# Patient Record
Sex: Female | Born: 1945
Health system: Southern US, Community
[De-identification: ages and names within clinical notes are randomized; demographics above are authoritative.]

## PROBLEM LIST (undated history)

## (undated) DIAGNOSIS — F329 Major depressive disorder, single episode, unspecified: Secondary | ICD-10-CM

## (undated) DIAGNOSIS — E039 Hypothyroidism, unspecified: Secondary | ICD-10-CM

## (undated) DIAGNOSIS — D126 Benign neoplasm of colon, unspecified: Secondary | ICD-10-CM

## (undated) DIAGNOSIS — E785 Hyperlipidemia, unspecified: Secondary | ICD-10-CM

## (undated) DIAGNOSIS — T7840XA Allergy, unspecified, initial encounter: Secondary | ICD-10-CM

## (undated) DIAGNOSIS — I10 Essential (primary) hypertension: Secondary | ICD-10-CM

## (undated) DIAGNOSIS — K219 Gastro-esophageal reflux disease without esophagitis: Secondary | ICD-10-CM

## (undated) DIAGNOSIS — C801 Malignant (primary) neoplasm, unspecified: Secondary | ICD-10-CM

## (undated) DIAGNOSIS — K5790 Diverticulosis of intestine, part unspecified, without perforation or abscess without bleeding: Secondary | ICD-10-CM

## (undated) DIAGNOSIS — K222 Esophageal obstruction: Secondary | ICD-10-CM

## (undated) DIAGNOSIS — F419 Anxiety disorder, unspecified: Secondary | ICD-10-CM

## (undated) DIAGNOSIS — K5909 Other constipation: Secondary | ICD-10-CM

## (undated) DIAGNOSIS — F1010 Alcohol abuse, uncomplicated: Secondary | ICD-10-CM

## (undated) DIAGNOSIS — F32A Depression, unspecified: Secondary | ICD-10-CM

## (undated) DIAGNOSIS — M1990 Unspecified osteoarthritis, unspecified site: Secondary | ICD-10-CM

## (undated) DIAGNOSIS — C719 Malignant neoplasm of brain, unspecified: Secondary | ICD-10-CM

## (undated) DIAGNOSIS — F1910 Other psychoactive substance abuse, uncomplicated: Secondary | ICD-10-CM

## (undated) HISTORY — PX: COLONOSCOPY: SHX174

## (undated) HISTORY — DX: Major depressive disorder, single episode, unspecified: F32.9

## (undated) HISTORY — DX: Other psychoactive substance abuse, uncomplicated: F19.10

## (undated) HISTORY — DX: Gastro-esophageal reflux disease without esophagitis: K21.9

## (undated) HISTORY — DX: Alcohol abuse, uncomplicated: F10.10

## (undated) HISTORY — DX: Malignant (primary) neoplasm, unspecified: C80.1

## (undated) HISTORY — DX: Unspecified osteoarthritis, unspecified site: M19.90

## (undated) HISTORY — DX: Malignant neoplasm of brain, unspecified: C71.9

## (undated) HISTORY — PX: BRAIN SURGERY: SHX531

## (undated) HISTORY — PX: TONSILLECTOMY: SUR1361

## (undated) HISTORY — DX: Esophageal obstruction: K22.2

## (undated) HISTORY — DX: Anxiety disorder, unspecified: F41.9

## (undated) HISTORY — DX: Benign neoplasm of colon, unspecified: D12.6

## (undated) HISTORY — DX: Diverticulosis of intestine, part unspecified, without perforation or abscess without bleeding: K57.90

## (undated) HISTORY — DX: Other constipation: K59.09

## (undated) HISTORY — PX: EYELID REPAIR W/ SKIN GRAFT: SHX1565

## (undated) HISTORY — DX: Hypothyroidism, unspecified: E03.9

## (undated) HISTORY — DX: Depression, unspecified: F32.A

## (undated) HISTORY — DX: Essential (primary) hypertension: I10

## (undated) HISTORY — DX: Allergy, unspecified, initial encounter: T78.40XA

## (undated) HISTORY — DX: Hyperlipidemia, unspecified: E78.5

---

## 1993-08-20 ENCOUNTER — Encounter: Payer: Self-pay | Admitting: Gastroenterology

## 1993-10-08 ENCOUNTER — Encounter: Payer: Self-pay | Admitting: Gastroenterology

## 1998-03-03 ENCOUNTER — Other Ambulatory Visit: Admission: RE | Admit: 1998-03-03 | Discharge: 1998-03-03 | Payer: Self-pay | Admitting: Obstetrics and Gynecology

## 1999-02-19 ENCOUNTER — Other Ambulatory Visit: Admission: RE | Admit: 1999-02-19 | Discharge: 1999-02-19 | Payer: Self-pay | Admitting: Obstetrics and Gynecology

## 1999-04-01 ENCOUNTER — Encounter: Admission: RE | Admit: 1999-04-01 | Discharge: 1999-04-01 | Payer: Self-pay | Admitting: Obstetrics and Gynecology

## 2001-04-17 ENCOUNTER — Encounter: Payer: Self-pay | Admitting: Gastroenterology

## 2001-07-16 ENCOUNTER — Other Ambulatory Visit: Admission: RE | Admit: 2001-07-16 | Discharge: 2001-07-16 | Payer: Self-pay | Admitting: Obstetrics and Gynecology

## 2002-01-16 ENCOUNTER — Encounter: Payer: Self-pay | Admitting: Gastroenterology

## 2002-07-29 ENCOUNTER — Other Ambulatory Visit: Admission: RE | Admit: 2002-07-29 | Discharge: 2002-07-29 | Payer: Self-pay | Admitting: Obstetrics and Gynecology

## 2003-09-10 ENCOUNTER — Other Ambulatory Visit: Admission: RE | Admit: 2003-09-10 | Discharge: 2003-09-10 | Payer: Self-pay | Admitting: Obstetrics and Gynecology

## 2004-10-20 ENCOUNTER — Other Ambulatory Visit: Admission: RE | Admit: 2004-10-20 | Discharge: 2004-10-20 | Payer: Self-pay | Admitting: Obstetrics and Gynecology

## 2004-10-24 ENCOUNTER — Emergency Department (HOSPITAL_COMMUNITY): Admission: EM | Admit: 2004-10-24 | Discharge: 2004-10-24 | Payer: Self-pay | Admitting: Emergency Medicine

## 2005-08-02 ENCOUNTER — Encounter: Admission: RE | Admit: 2005-08-02 | Discharge: 2005-08-02 | Payer: Self-pay | Admitting: Family Medicine

## 2005-10-24 ENCOUNTER — Other Ambulatory Visit: Admission: RE | Admit: 2005-10-24 | Discharge: 2005-10-24 | Payer: Self-pay | Admitting: Obstetrics and Gynecology

## 2007-03-21 DIAGNOSIS — D126 Benign neoplasm of colon, unspecified: Secondary | ICD-10-CM

## 2007-03-21 HISTORY — DX: Benign neoplasm of colon, unspecified: D12.6

## 2007-03-26 ENCOUNTER — Ambulatory Visit: Payer: Self-pay | Admitting: Gastroenterology

## 2007-04-06 ENCOUNTER — Encounter: Payer: Self-pay | Admitting: Gastroenterology

## 2007-04-06 ENCOUNTER — Ambulatory Visit: Payer: Self-pay | Admitting: Gastroenterology

## 2007-09-28 ENCOUNTER — Ambulatory Visit: Payer: Self-pay | Admitting: Psychology

## 2007-10-11 ENCOUNTER — Ambulatory Visit: Payer: Self-pay | Admitting: Psychology

## 2007-11-28 ENCOUNTER — Ambulatory Visit: Payer: Self-pay | Admitting: Psychology

## 2008-11-24 ENCOUNTER — Telehealth: Payer: Self-pay | Admitting: Gastroenterology

## 2008-11-26 ENCOUNTER — Ambulatory Visit: Payer: Self-pay | Admitting: Internal Medicine

## 2008-11-26 DIAGNOSIS — E039 Hypothyroidism, unspecified: Secondary | ICD-10-CM | POA: Insufficient documentation

## 2008-11-26 DIAGNOSIS — Z860100 Personal history of colon polyps, unspecified: Secondary | ICD-10-CM | POA: Insufficient documentation

## 2008-11-26 DIAGNOSIS — K5900 Constipation, unspecified: Secondary | ICD-10-CM | POA: Insufficient documentation

## 2008-11-26 DIAGNOSIS — F329 Major depressive disorder, single episode, unspecified: Secondary | ICD-10-CM

## 2008-12-30 ENCOUNTER — Telehealth: Payer: Self-pay | Admitting: Gastroenterology

## 2009-01-26 ENCOUNTER — Ambulatory Visit: Payer: Self-pay | Admitting: Gastroenterology

## 2009-01-26 DIAGNOSIS — R079 Chest pain, unspecified: Secondary | ICD-10-CM | POA: Insufficient documentation

## 2009-01-26 DIAGNOSIS — K219 Gastro-esophageal reflux disease without esophagitis: Secondary | ICD-10-CM

## 2009-07-27 ENCOUNTER — Encounter (INDEPENDENT_AMBULATORY_CARE_PROVIDER_SITE_OTHER): Payer: Self-pay | Admitting: *Deleted

## 2009-07-28 ENCOUNTER — Ambulatory Visit: Payer: Self-pay | Admitting: Gastroenterology

## 2009-08-10 ENCOUNTER — Ambulatory Visit: Payer: Self-pay | Admitting: Gastroenterology

## 2009-08-12 ENCOUNTER — Encounter: Payer: Self-pay | Admitting: Gastroenterology

## 2009-10-26 ENCOUNTER — Ambulatory Visit: Payer: Self-pay | Admitting: Gastroenterology

## 2009-10-26 DIAGNOSIS — K222 Esophageal obstruction: Secondary | ICD-10-CM

## 2010-07-20 NOTE — Letter (Signed)
Summary: Appt Reminder 2  [facility]  [address] [place], [place] [address]   Phone: [phone]  Fax: [phone]        August 10, 2009 MRN: [id number]    [facility] [address] [place], [place]  [address]    Dear Ms. Bechtol,   You have a return appointment with Dr. Russella Dar on 09/28/09 at 1:30 pm.  Please remember to bring a complete list of the medicines you are taking, your insurance card and your co-pay.  If you have to cancel or reschedule this appointment, please call before 5:00 pm the evening before to avoid a cancellation fee.  If you have any questions or concerns, please call [phone].    Sincerely,    Darcey Nora RN, CGRN

## 2010-07-20 NOTE — Assessment & Plan Note (Signed)
Summary: follow up EGD/sheri   History of Present Illness Visit Type: Follow-up Visit Primary GI MD: Elie Goody MD [facility] Primary Provider: Tracey Harries, MD Chief Complaint: solid food dysphagia, pt states it feels like her esophagus is tightening again History of Present Illness:   Kristina Anthony returns today with recurrent dysphagia. She initially had an excellent response to dilation without any symptoms, but over the past 2 weeks, she has noted occasional mild solid food dysphagia. She is taking omeprazole intermittently and not on a daily basis is recommended.   GI Review of Systems    Reports dysphagia with solids.      Denies abdominal pain, acid reflux, belching, bloating, chest pain, dysphagia with liquids, heartburn, loss of appetite, nausea, vomiting, vomiting blood, weight loss, and  weight gain.        Denies anal fissure, black tarry stools, change in bowel habit, constipation, diarrhea, diverticulosis, fecal incontinence, heme positive stool, hemorrhoids, irritable bowel syndrome, jaundice, light color stool, liver problems, rectal bleeding, and  rectal pain.   Current Medications (verified): 1)  Levothyroxine (Unknown Dosage) .... Take 1 Tablet By Mouth Once A Day 2)  Multivitamins  Tabs (Multiple Vitamin) .... Take 1 Tablet By Mouth Once A Day 3)  Vitamin D (Unknown Dosage) .... Take 1 Tablet By Mouth Once A Day 4)  Calcium (Unknown Dosage) .... Take 1 Tablet By Mouth Once A Day 5)  Miralax  Powd (Polyethylene Glycol 3350) .... Take 1 Scoop Dissolved in Water Every Other Day 6)  Omeprazole 40 Mg  Cpdr (Omeprazole) .Marland Kitchen.. 1 Each Day 30 Minutes Before Meal  Allergies (verified): No Known Drug Allergies  Past History:  Past Medical History: COLON POLYPS, ADENOMATOUS 03/2007 DIVERTICULOSIS CHRONIC CONSTIPATION HYPOTHYROIDISM ANXIETY H/O ALCOHOL ABUSE ESOPHAGEAL STRICTURE  Past Surgical History: Reviewed history from 11/26/2008 and no changes required. Eyelid  Surgery- Bilateral Tonsillectomy  Family History: Reviewed history from 11/26/2008 and no changes required. Family History of Colon Cancer: Mother Family History of Prostate Cancer: Prostate Family History of Colon Polyps: Brother, Father, Mother Family History of Heart Disease: Grandfather  Social History: Reviewed history from 11/26/2008 and no changes required. Patient currently smokes. -very rare Alcohol Use - yes-3-4 glasses per week Daily Caffeine Use-1.5 cups daily Illicit Drug Use - no Patient gets regular exercise.  Review of Systems       The pertinent positives and negatives are noted as above and in the HPI. All other ROS were reviewed and were negative.   Vital Signs:  Patient profile:   65 year old female Height:      69 inches Weight:      164 pounds BMI:     24.31 Pulse rate:   76 / minute Pulse rhythm:   regular BP sitting:   124 / 82  (left arm) Cuff size:   regular  Vitals Entered By: Francee Piccolo CMA Duncan Dull) (Oct 26, 2009 4:12 PM)  Physical Exam  General:  Well developed, well nourished, no acute distress. Head:  Normocephalic and atraumatic.  Impression & Recommendations:  Problem # 1:  ESOPHAGEAL STRICTURE (ICD-530.3) Recurrent dysphagia post dilation in 07/2009. Rule out GERD versus a recurrent stricture. Intensify all antireflux measures. She is advised to take omeprazole 40 mg daily 30 minutes for breakfast every day long-term. For dysphagia does not improve over the next few weeks. She will call to set up appointment for repeat endoscopy with Savary dilation.  Problem # 2:  PERSONAL HX COLONIC POLYPS (ICD-V12.72) History of  adenomatous colon polyps in October 2008. Surveillance colonoscopy recommended in October 2013.  Problem # 3:  FAMILY HX COLON CANCER (ICD-V16.0) See problem #2.  Problem # 4:  CONSTIPATION (ICD-564.00) Continue a high fiber diet with adequate daily water intake, and MiraLax.  Patient Instructions: 1)  Avoid  foods high in acid content ( tomatoes, citrus juices, spicy foods) . Avoid eating within 3 to 4 hours of lying down or before exercising. Do not over eat; try smaller more frequent meals. Elevate head of bed four inches when sleeping.  2)  Copy sent to : Tracey Harries, MD 3)  The medication list was reviewed and reconciled.  All changed / newly prescribed medications were explained.  A complete medication list was provided to the patient / caregiver.

## 2010-07-20 NOTE — Procedures (Signed)
Summary: [facility]   Imported By: Sherian Rein 10/27/2009 11:12:58  _____________________________________________________________________  External Attachment:    Type:   Image     Comment:   External Document

## 2010-07-20 NOTE — Consult Note (Signed)
Summary: GI Consult/[facility]  GI Consult/[facility]   Imported By: Sherian Rein 10/27/2009 11:15:38  _____________________________________________________________________  External Attachment:    Type:   Image     Comment:   External Document

## 2010-07-20 NOTE — Letter (Signed)
Summary: EGD Instructions  [facility]  [address] [place], [place] [address]   Phone: [phone]  Fax: [phone]       Jadis FOCHT    [birth year]    MRN: [id number]       Procedure Day /Date: Monday 08/10/09     Arrival Time:  9:00 am     Procedure Time: 10:00 am     Location of Procedure:                    _x _  [facility]oscopy Center (4th Floor)   PREPARATION FOR ENDOSCOPY   On Monday 08/10/09 THE DAY OF THE PROCEDURE:  1.   No solid foods, milk or milk products are allowed after midnight the night before your procedure.  2.   Do not drink anything colored red or purple.  Avoid juices with pulp.  No orange juice.  3.  You may drink clear liquids until 8:00 am, which is 2 hours before your procedure.                                                                                                CLEAR LIQUIDS INCLUDE: Water Jello Ice Popsicles Tea (sugar ok, no milk/cream) Powdered fruit flavored drinks Coffee (sugar ok, no milk/cream) Gatorade Juice: apple, white grape, white cranberry  Lemonade Clear bullion, consomm, broth Carbonated beverages (any kind) Strained chicken noodle soup Hard Candy   MEDICATION INSTRUCTIONS  Unless otherwise instructed, you should take regular prescription medications with a small sip of water as early as possible the morning of your procedure.               OTHER INSTRUCTIONS  You will need a responsible adult at least 63 years of age to accompany you and drive you home.   This person must remain in the waiting room during your procedure.  Wear loose fitting clothing that is easily removed.  Leave jewelry and other valuables at home.  However, you may wish to bring a book to read or an iPod/MP3 player to listen to music as you wait for your procedure to start.  Remove all body piercing jewelry and leave at home.  Total time from sign-in until discharge is approximately 2-3 hours.  You should  go home directly after your procedure and rest.  You can resume normal activities the day after your procedure.  The day of your procedure you should not:   Drive   Make legal decisions   Operate machinery   Drink alcohol   Return to work  You will receive specific instructions about eating, activities and medications before you leave.    The above instructions have been reviewed and explained to me by   Clide Cliff, RN______________________    I fully understand and can verbalize these instructions _____________________________ Date _________

## 2010-07-20 NOTE — Miscellaneous (Signed)
Summary: Omeprazole rx  Clinical Lists Changes  Medications: Added new medication of OMEPRAZOLE 40 MG  CPDR (OMEPRAZOLE) 1 each day 30 minutes before meal - Signed Rx of OMEPRAZOLE 40 MG  CPDR (OMEPRAZOLE) 1 each day 30 minutes before meal;  #30 x 11;  Signed;  Entered by: Karl Bales RN;  Authorized by: Meryl Dare MD [facility];  Method used: Electronically to [id number]*, [address], [place], [place], [place]  [address], Ph: [phone], Fax: [phone]    Prescriptions: OMEPRAZOLE 40 MG  CPDR (OMEPRAZOLE) 1 each day 30 minutes before meal  #30 x 11   Entered by:   Karl Bales RN   Authorized by:   Meryl Dare MD [facility]   Signed by:   Karl Bales RN on 08/10/2009   Method used:   Electronically to        [facility] [phone]* (retail)       [address]       [place], [place]  [address]       Ph: [phone]       Fax: [phone]   RxID:   [id number]

## 2010-07-20 NOTE — Letter (Signed)
Summary: [facility] Biopsy Results  [facility]  [address] [place], [place] [address]   Phone: [phone]  Fax: [phone]        August 12, 2009 MRN: [id number]    [facility] [address] [place], [place]  [address]    Dear Ms. Degeorge,  I am pleased to inform you that the biopsies taken during your recent endoscopic examination did not show any evidence of cancer upon pathologic examination. The biopsy showed chronic inflammation.  Continue with the treatment plan as outlined on the day of your      exam.  Please call us if you are having persistent problems or have questions about your condition that have not been fully answered at this time.  Sincerely,  Meryl Dare MD [facility]  This letter has been electronically signed by your physician.  Appended Document: Patient Notice-Endo Biopsy Results Letter mailed 2.25.11

## 2010-07-20 NOTE — Procedures (Signed)
Summary: Upper Endoscopy  Patient: [name] Note: All result statuses are Final unless otherwise noted.  Tests: (1) Upper Endoscopy (EGD)   EGD Upper Endoscopy       DONE     [facility]     [address]     [place], [place]  [address]           ENDOSCOPY PROCEDURE REPORT           PATIENT:  Kristina Anthony, Kristina Anthony  MR#:  [id number]     BIRTHDATE:  01/24/1946, 64 yrs. old  GENDER:  female           ENDOSCOPIST:  Judie Petit T. Russella Dar, MD, [facility]           PROCEDURE DATE:  08/10/2009     PROCEDURE:  EGD with dilatation over guidewire, and with biopsy     ASA CLASS:  Class II     INDICATIONS:  GERD, chest pain, dysphagia           MEDICATIONS:  Fentanyl 100 mcg IV, Versed 12 mg IV     TOPICAL ANESTHETIC:  Exactacain Spray           DESCRIPTION OF PROCEDURE:   After the risks benefits and     alternatives of the procedure were thoroughly explained, informed     consent was obtained.  The [facility] [id number] endoscope was     introduced through the mouth and advanced to the second portion of     the duodenum, without limitations.  The instrument was slowly     withdrawn as the mucosa was fully examined.     <<PROCEDUREIMAGES>>           A stricture was found at the gastroesophageal junction. It was     bening appearing, measuring 13 mm in diameter. Multiple biopsies     were obtained and sent to pathology. Savary dilation over a     guidewire with 14 mm Savary, 15 mm Savary and 16 mm Savary with     minimal resistance to all 3 dilators and a small amount of heme on     the last dilator. The stomach was entered and closely examined.     The pylorus, antrum, angularis, and lesser curvature were well     visualized, including a retroflexed view of the cardia and fundus.     The stomach wall was normally distensable. The scope passed easily     through the pylorus into the duodenum. The duodenal bulb was     normal in appearance, as was the postbulbar duodenum. Retroflexed  views revealed a hiatal hernia., small.  The scope was then     withdrawn from the patient and the procedure completed.           COMPLICATIONS:  None           ENDOSCOPIC IMPRESSION:     1) Stricture at the gastroesophageal junction     2) Small hiatal hernia           RECOMMENDATIONS:     1) await pathology results     2) PPI qam long term     3) post dilation instructions     4) office follow up in 4-6 weeks     5) antireflux measures long term           Judie Petit T. Russella Dar, MD, [facility]           CC:  Tracey Harries,  MD           n.     Rosalie DoctorJudie Petit T. Elenna Spratling at 08/10/2009 10:21 AM           Felicity Pellegrini, [id number]  Note: An exclamation mark (!) indicates a result that was not dispersed into the flowsheet. Document Creation Date: 08/10/2009 10:21 AM _______________________________________________________________________  (1) Order result status: Final Collection or observation date-time: 08/10/2009 10:10 Requested date-time:  Receipt date-time:  Reported date-time:  Referring Physician:   Ordering Physician: Claudette Head [phone]) Specimen Source:  Source: Launa Grill Order Number: [id number] Lab site:

## 2010-07-20 NOTE — Miscellaneous (Signed)
Summary: 530.81,584.00,786.50--ch.  Clinical Lists Changes  Observations: Added new observation of ALLERGY REV: Done (07/28/2009 16:08)

## 2010-07-20 NOTE — Letter (Signed)
Summary: Melonie Florida MD  Melonie Florida MD   Imported By: Sherian Rein 10/27/2009 11:14:14  _____________________________________________________________________  External Attachment:    Type:   Image     Comment:   External Document

## 2010-09-11 ENCOUNTER — Other Ambulatory Visit: Payer: Self-pay | Admitting: Gastroenterology

## 2010-11-10 ENCOUNTER — Telehealth: Payer: Self-pay | Admitting: Gastroenterology

## 2010-11-10 ENCOUNTER — Ambulatory Visit: Payer: Self-pay | Admitting: Gastroenterology

## 2010-11-10 MED ORDER — OMEPRAZOLE 40 MG PO CPDR: 40.0000 mg | DELAYED_RELEASE_CAPSULE | Freq: Every day | ORAL | Status: DC

## 2010-11-10 NOTE — Telephone Encounter (Signed)
rx sent

## 2010-12-29 ENCOUNTER — Encounter: Payer: Self-pay | Admitting: Gastroenterology

## 2010-12-29 ENCOUNTER — Ambulatory Visit (INDEPENDENT_AMBULATORY_CARE_PROVIDER_SITE_OTHER): Payer: BC Managed Care – PPO | Admitting: Gastroenterology

## 2010-12-29 VITALS — BP 128/70 | HR 88 | Ht 69.0 in | Wt 168.0 lb

## 2010-12-29 DIAGNOSIS — Z800 Family history of malignant neoplasm of digestive organs: Secondary | ICD-10-CM

## 2010-12-29 DIAGNOSIS — K5900 Constipation, unspecified: Secondary | ICD-10-CM

## 2010-12-29 DIAGNOSIS — Z86010 Personal history of colonic polyps: Secondary | ICD-10-CM

## 2010-12-29 DIAGNOSIS — K219 Gastro-esophageal reflux disease without esophagitis: Secondary | ICD-10-CM

## 2010-12-29 MED ORDER — OMEPRAZOLE 40 MG PO CPDR: 40.0000 mg | DELAYED_RELEASE_CAPSULE | Freq: Every day | ORAL | Status: DC

## 2010-12-29 NOTE — Patient Instructions (Signed)
Your prescription has been sent to your pharmacy 

## 2010-12-29 NOTE — Progress Notes (Signed)
History of Present Illness: This is a 65 year old female who returns for followup of GERD with a history of esophageal stricture, constipation and adenomatous colon polyps. She underwent endoscopy in February 2011. Her reflux symptoms and dysphagia have remained under excellent control on omeprazole 40 mg daily. She has mild intermittent constipation and takes MiraLax on an as-needed basis. She denies weight loss, change in bowel habits, melena, hematochezia, dysphagia.  Current Medications, Allergies, Past Medical History, Past Surgical History, Family History and Social History were reviewed in [employer] record.  Physical Exam: General: Well developed , well nourished, no acute distress Head: Normocephalic and atraumatic Eyes:  sclerae anicteric, EOMI Ears: Normal auditory acuity Mouth: No deformity or lesions Lungs: Clear throughout to auscultation Heart: Regular rate and rhythm; no murmurs, rubs or bruits Abdomen: Soft, non tender and non distended. No masses, hepatosplenomegaly or hernias noted. Normal Bowel sounds Musculoskeletal: Symmetrical with no gross deformities  Pulses:  Normal pulses noted Neurological: Alert oriented x 4, grossly nonfocal Psychological:  Alert and cooperative. Normal mood and affect  Assessment and Recommendations:  1. GERD with a history of an esophageal stricture. Continue omeprazole 40 mg daily and standard antireflux measures.  2. Mild constipation. MiraLax when necessary.   3. Personal history of adenomatous colon polyps and a family history of colon cancer in her mother at age 13. Surveillance colonoscopy due October 2013.

## 2010-12-30 ENCOUNTER — Encounter: Payer: Self-pay | Admitting: Gastroenterology

## 2012-01-17 ENCOUNTER — Other Ambulatory Visit: Payer: Self-pay | Admitting: Gastroenterology

## 2012-01-17 NOTE — Telephone Encounter (Signed)
NEEDS OFFICE VISIT FOR ANY FURTHER REFILLS! 

## 2012-02-09 ENCOUNTER — Encounter: Payer: Self-pay | Admitting: Internal Medicine

## 2012-03-23 ENCOUNTER — Other Ambulatory Visit: Payer: Self-pay | Admitting: Gastroenterology

## 2012-03-23 MED ORDER — OMEPRAZOLE 40 MG PO CPDR: 40.0000 mg | DELAYED_RELEASE_CAPSULE | Freq: Every day | ORAL | Status: DC

## 2012-03-23 NOTE — Telephone Encounter (Signed)
NEEDS OFFICE VISIT FOR ANY FURTHER REFILLS! 

## 2012-03-23 NOTE — Telephone Encounter (Signed)
Patient denies any GI sx and just needs a refill until the first of the year when her insurance changes. Told her I will send several refills until then but to call back in November to schedule a follow up appt for January. Patient agreed and verbalized understanding.

## 2012-04-13 ENCOUNTER — Ambulatory Visit: Payer: BC Managed Care – PPO | Admitting: Gastroenterology

## 2012-05-22 ENCOUNTER — Ambulatory Visit: Payer: Self-pay | Admitting: Obstetrics and Gynecology

## 2012-07-09 ENCOUNTER — Encounter: Payer: Self-pay | Admitting: Gastroenterology

## 2012-07-09 ENCOUNTER — Ambulatory Visit (INDEPENDENT_AMBULATORY_CARE_PROVIDER_SITE_OTHER): Payer: Medicare Other | Admitting: Gastroenterology

## 2012-07-09 VITALS — BP 130/84 | HR 76 | Ht 68.0 in | Wt 174.2 lb

## 2012-07-09 DIAGNOSIS — Z860100 Personal history of colon polyps, unspecified: Secondary | ICD-10-CM

## 2012-07-09 DIAGNOSIS — K219 Gastro-esophageal reflux disease without esophagitis: Secondary | ICD-10-CM

## 2012-07-09 MED ORDER — OMEPRAZOLE 40 MG PO CPDR: 40.0000 mg | DELAYED_RELEASE_CAPSULE | Freq: Every day | ORAL | Status: DC

## 2012-07-09 MED ORDER — PEG-KCL-NACL-NASULF-NA ASC-C 100 G PO SOLR: 1.0000 | Freq: Once | ORAL | Status: DC

## 2012-07-09 NOTE — Progress Notes (Signed)
History of Present Illness: This is a 67 year old female with a history of GERD and peptic stricture. Reflux symptoms are under excellent control on omeprazole 40 mg daily. She has no gastrointestinal complaints except for rare episodes of constipation easily treated with MiraLax. She is due for surveillance colonoscopy. Denies weight loss, abdominal pain, diarrhea, change in stool caliber, melena, hematochezia, nausea, vomiting, dysphagia, reflux symptoms, chest pain.  Current Medications, Allergies, Past Medical History, Past Surgical History, Family History and Social History were reviewed in [employer] record.  Physical Exam: General: Well developed , well nourished, no acute distress Head: Normocephalic and atraumatic Eyes:  sclerae anicteric, EOMI Ears: Normal auditory acuity Mouth: No deformity or lesions Lungs: Clear throughout to auscultation Heart: Regular rate and rhythm; no murmurs, rubs or bruits Abdomen: Soft, non tender and non distended. No masses, hepatosplenomegaly or hernias noted. Normal Bowel sounds Rectal: Deferred to colonoscopy Musculoskeletal: Symmetrical with no gross deformities  Pulses:  Normal pulses noted Extremities: No clubbing, cyanosis, edema or deformities noted Neurological: Alert oriented x 4, grossly nonfocal Psychological:  Alert and cooperative. Normal mood and affect  Assessment and Recommendations:  1. GERD with a history of a peptic stricture. Continue standard antireflux measures and omeprazole 40 mg daily.  2. Personal history of adenomatous colon polyps in October 2008. She is due for surveillance colonoscopy. The risks, benefits, and alternatives to colonoscopy with possible biopsy and possible polypectomy were discussed with the patient and they consent to proceed.

## 2012-07-09 NOTE — Patient Instructions (Addendum)
You have been scheduled for a colonoscopy with propofol. Please follow written instructions given to you at your visit today.  Please pick up your prep kit at the pharmacy within the next 1-3 days. If you use inhalers (even only as needed) or a CPAP machine, please bring them with you on the day of your procedure.  We have sent the following medications to your pharmacy for you to pick up at your convenience: Omeprazole.  cc: Tracey Harries, MD

## 2012-08-06 ENCOUNTER — Ambulatory Visit: Payer: Medicare Other | Admitting: Obstetrics and Gynecology

## 2012-08-06 ENCOUNTER — Encounter: Payer: Self-pay | Admitting: Obstetrics and Gynecology

## 2012-08-06 VITALS — BP 128/72 | Ht 68.5 in | Wt 175.0 lb

## 2012-08-06 DIAGNOSIS — Z124 Encounter for screening for malignant neoplasm of cervix: Secondary | ICD-10-CM

## 2012-08-06 DIAGNOSIS — Z8041 Family history of malignant neoplasm of ovary: Secondary | ICD-10-CM

## 2012-08-06 DIAGNOSIS — M8580 Other specified disorders of bone density and structure, unspecified site: Secondary | ICD-10-CM

## 2012-08-06 NOTE — Progress Notes (Signed)
ANNUAL:  Last Pap: 02/07/2010 with HPV WNL: Yes Regular Periods:no Contraception: Post-Menopausal  Monthly Breast exam:yes Tetanus<53yrs:yes Nl.Bladder Function:yes Daily BMs:yes Healthy Diet:yes Calcium:yes Mammogram:yes Date of Mammogram: 07/11/2012 Exercise:yes Have often Exercise: 2-3 times weekly Seatbelt: yes Abuse at home: no Stressful work:no Sigmoid-colonoscopy: 2008 / scheduled for 08/14/12 Bone Density: Yes 12/17/2009 osteopenia PCP: Tracey Harries Change in PMH: None Change in [facility]: None  Subjective:    Kristina Anthony is a 67 y.o. female G2P0 who presents for annual exam.  The patient has no complaints today. She wants assessment for ovarian cancer with u/s.  The following portions of the patient's history were reviewed and updated as appropriate: allergies, current medications, past family history, past medical history, past social history, past surgical history and problem list.  Review of Systems Pertinent items are noted in HPI. Gastrointestinal:No change in bowel habits, no abdominal pain, no rectal bleeding Genitourinary:negative for dysuria, frequency, hematuria, nocturia and urinary incontinence    Objective:     BP 128/72  Ht 5' 8.5" (1.74 m)  Wt 175 lb (79.379 kg)  BMI 26.22 kg/m2  Weight:  Wt Readings from Last 1 Encounters:  07/09/12 174 lb 4 oz (79.039 kg)     BMI: Body mass index is 26.22 kg/(m^2). General Appearance: Alert, appropriate appearance for age. No acute distress HEENT: Grossly normal Neck / Thyroid: Supple, no masses, nodes or enlargement Lungs: clear to auscultation bilaterally Back: No CVA tenderness Breast Exam: No masses or nodes.No dimpling, nipple retraction or discharge. Cardiovascular: Regular rate and rhythm. S1, S2, no murmur Gastrointestinal: Soft, non-tender, no masses or organomegaly Pelvic Exam: External genitalia: normal general appearance Vaginal: atrophic mucosa Cervix: atrophic Adnexa: no masses Uterus:  normal single, nontender Rectovaginal: normal rectal, no masses Lymphatic Exam: Non-palpable nodes in neck, clavicular, axillary, or inguinal regions Skin: no rash or abnormalities Neurologic: Normal gait and speech, no tremor  Psychiatric: Alert and oriented, appropriate affect.    Urinalysis:Not done    Assessment:    Normal gyn exam  Family hx ovarian cancer Osteopenia    Plan:   mammogram pap smear with HR HPV U/S and DXA Vit D level today return annually or prn    Dierdre Forth MD

## 2012-08-07 LAB — PAP IG AND HPV HIGH-RISK: HPV DNA High Risk: NOT DETECTED

## 2012-08-14 ENCOUNTER — Encounter: Payer: Self-pay | Admitting: Gastroenterology

## 2012-08-14 ENCOUNTER — Ambulatory Visit (AMBULATORY_SURGERY_CENTER): Payer: Medicare Other | Admitting: Gastroenterology

## 2012-08-14 VITALS — BP 145/87 | HR 61 | Temp 98.2°F | Resp 18 | Ht 68.0 in | Wt 174.0 lb

## 2012-08-14 DIAGNOSIS — Z86010 Personal history of colonic polyps: Secondary | ICD-10-CM

## 2012-08-14 MED ORDER — SODIUM CHLORIDE 0.9 % IV SOLN: 500.0000 mL | INTRAVENOUS | Status: DC

## 2012-08-14 NOTE — Progress Notes (Signed)
Patient did not experience any of the following events: a burn prior to discharge; a fall within the facility; wrong site/side/patient/procedure/implant event; or a hospital transfer or hospital admission upon discharge from the facility. (G8907) Patient did not have preoperative order for IV antibiotic SSI prophylaxis. (G8918)  

## 2012-08-14 NOTE — Patient Instructions (Signed)
YOU HAD AN ENDOSCOPIC PROCEDURE TODAY AT THE [facility]: Refer to the procedure report that was given to you for any specific questions about what was found during the examination.  If the procedure report does not answer your questions, please call your gastroenterologist to clarify.  If you requested that your care partner not be given the details of your procedure findings, then the procedure report has been included in a sealed envelope for you to review at your convenience later.  YOU SHOULD EXPECT: Some feelings of bloating in the abdomen. Passage of more gas than usual.  Walking can help get rid of the air that was put into your GI tract during the procedure and reduce the bloating. If you had a lower endoscopy (such as a colonoscopy or flexible sigmoidoscopy) you may notice spotting of blood in your stool or on the toilet paper. If you underwent a bowel prep for your procedure, then you may not have a normal bowel movement for a few days.  DIET: Your first meal following the procedure should be a light meal and then it is ok to progress to your normal diet.  A half-sandwich or bowl of soup is an example of a good first meal.  Heavy or fried foods are harder to digest and may make you feel nauseous or bloated.  Likewise meals heavy in dairy and vegetables can cause extra gas to form and this can also increase the bloating.  Drink plenty of fluids but you should avoid alcoholic beverages for 24 hours.  ACTIVITY: Your care partner should take you home directly after the procedure.  You should plan to take it easy, moving slowly for the rest of the day.  You can resume normal activity the day after the procedure however you should NOT DRIVE or use heavy machinery for 24 hours (because of the sedation medicines used during the test).    SYMPTOMS TO REPORT IMMEDIATELY: A gastroenterologist can be reached at any hour.  During normal business hours, 8:30 AM to 5:00 PM Monday through Friday,  call [phone].  After hours and on weekends, please call the GI answering service at [phone] who will take a message and have the physician on call contact you.   Following lower endoscopy (colonoscopy or flexible sigmoidoscopy):  Excessive amounts of blood in the stool  Significant tenderness or worsening of abdominal pains  Swelling of the abdomen that is new, acute  Fever of 100F or higher    FOLLOW UP: If any biopsies were taken you will be contacted by phone or by letter within the next 1-3 weeks.  Call your gastroenterologist if you have not heard about the biopsies in 3 weeks.  Our staff will call the home number listed on your records the next business day following your procedure to check on you and address any questions or concerns that you may have at that time regarding the information given to you following your procedure. This is a courtesy call and so if there is no answer at the home number and we have not heard from you through the emergency physician on call, we will assume that you have returned to your regular daily activities without incident.  SIGNATURES/CONFIDENTIALITY: You and/or your care partner have signed paperwork which will be entered into your electronic medical record.  These signatures attest to the fact that that the information above on your After Visit Summary has been reviewed and is understood.  Full responsibility of the confidentiality   of this discharge information lies with you and/or your care-partner.   Information on diverticulosis, high fiber diet & hemorrhoids given to you today

## 2012-08-14 NOTE — Op Note (Signed)
[facility] [address] [place], [address]   COLONOSCOPY PROCEDURE REPORT  PATIENT: Kristina Anthony, Kristina Anthony  MR#: [id number] BIRTHDATE: [birth year] , 67  yrs. old GENDER: Female ENDOSCOPIST: Meryl Dare, MD, [facility] PROCEDURE DATE:  08/14/2012 PROCEDURE:   Colonoscopy, screening ASA CLASS:   Class II INDICATIONS:Patient's personal history of adenomatous colon polyps.  MEDICATIONS: MAC sedation, administered by CRNA and propofol (Diprivan) 350mg  IV DESCRIPTION OF PROCEDURE:   After the risks benefits and alternatives of the procedure were thoroughly explained, informed consent was obtained.  A digital rectal exam revealed no abnormalities of the rectum.   The LB [id number]  endoscope was introduced through the anus and advanced to the cecum, which was identified by both the appendix and ileocecal valve. No adverse events experienced.   The quality of the prep was excellent, using MoviPrep  The instrument was then slowly withdrawn as the colon was fully examined.  COLON FINDINGS: Mild diverticulosis was noted in the sigmoid colon. The colon was otherwise normal.  There was no diverticulosis, inflammation, polyps or cancers unless previously stated. Retroflexed views revealed moderate internal hemorrhoids. The time to cecum=3 minutes 22 seconds.  Withdrawal time=9 minutes 33 seconds.  The scope was withdrawn and the procedure completed.  COMPLICATIONS: There were no complications.  ENDOSCOPIC IMPRESSION: 1.   Mild diverticulosis was noted in the sigmoid colon 2.   Moderate internal hemorrhoids  RECOMMENDATIONS: 1.  High fiber diet with liberal fluid intake. 2.  Repeat Colonoscopy in 5 years.   eSigned:  Meryl Dare, MD, [facility] 08/14/2012 10:29 AM   cc: Tracey Harries, MD and Dierdre Forth, MD

## 2012-08-15 ENCOUNTER — Telehealth: Payer: Self-pay

## 2012-08-15 NOTE — Telephone Encounter (Signed)
  Follow up Call-  Call back number 08/14/2012  Post procedure Call Back phone  # [phone]  Permission to leave phone message Yes     Patient questions:  Do you have a fever, pain , or abdominal swelling? no Pain Score  0 *  Have you tolerated food without any problems? yes  Have you been able to return to your normal activities? yes  Do you have any questions about your discharge instructions: Diet   no Medications  no Follow up visit  no  Do you have questions or concerns about your Care? no  Actions: * If pain score is 4 or above: No action needed, pain <4.  No problems per the pt. Maw

## 2012-09-23 ENCOUNTER — Ambulatory Visit (INDEPENDENT_AMBULATORY_CARE_PROVIDER_SITE_OTHER): Payer: Medicare Other | Admitting: Emergency Medicine

## 2012-09-23 VITALS — BP 144/82 | HR 84 | Temp 98.7°F | Resp 18 | Ht 70.0 in | Wt 175.0 lb

## 2012-09-23 DIAGNOSIS — J209 Acute bronchitis, unspecified: Secondary | ICD-10-CM

## 2012-09-23 MED ORDER — HYDROCOD POLST-CHLORPHEN POLST 10-8 MG/5ML PO LQCR: 5.0000 mL | Freq: Two times a day (BID) | ORAL | Status: DC | PRN

## 2012-09-23 MED ORDER — AZITHROMYCIN 250 MG PO TABS: ORAL_TABLET | ORAL | Status: DC

## 2012-09-23 NOTE — Patient Instructions (Addendum)

## 2012-09-23 NOTE — Progress Notes (Signed)
[facility] [address] [place] [address] [phone]  Date:  09/23/2012   Name:  Kristina Anthony   DOB:  12-27-1945   MRN:  [id number]  PCP:  Aura Dials, MD    Chief Complaint: Sore Throat and Nasal Congestion   History of Present Illness:  Kristina Anthony is a 67 y.o. very pleasant female patient who presents with the following:  Ill several days with nasal congestion and post nasal drip.  Now has a cough that is not productive.  No wheezing or shortness of breath.  Chills but no fever,  No nausea or vomiting.  No stool change.  No rash.  No improvement with over the counter medications or other home remedies. Denies other complaint or health concern today.   Patient Active Problem List  Diagnosis  . HYPOTHYROIDISM  . DEPRESSION  . ESOPHAGEAL STRICTURE  . GERD  . CONSTIPATION  . CHEST PAIN  . PERSONAL HX COLONIC POLYPS  . Family history of ovarian cancer    Past Medical History  Diagnosis Date  . Adenomatous colon polyp 03/2007  . Diverticulosis   . Chronic constipation   . Hypothyroidism   . Anxiety   . Alcohol abuse   . Esophageal stricture   . GERD (gastroesophageal reflux disease)   . Depression   . Hemorrhoids   . Substance abuse   . Allergy   . Arthritis   . Cancer     SKIN-REMOVED  . Hyperlipidemia   . Hypertension     Past Surgical History  Procedure Laterality Date  . Eyelid repair w/ skin graft    . Tonsillectomy    . Colonoscopy      History  Substance Use Topics  . Smoking status: Former Smoker    Types: Cigarettes  . Smokeless tobacco: Never Used  . Alcohol Use: Yes     Comment: 3-4 glasses per week-pt states she quit 2013     07/09/2012/sd    Family History  Problem Relation Age of Onset  . Colon cancer Mother     at age 24  . Prostate cancer Father   . Colon polyps Brother   . Colon polyps Father   . Colon polyps Mother   . Heart disease Maternal Grandfather     No Known  Allergies  Medication list has been reviewed and updated.  Current Outpatient Prescriptions on File Prior to Visit  Medication Sig Dispense Refill  . Atorvastatin Calcium (LIPITOR PO) Take 1 tablet by mouth daily.      . calcium carbonate 200 MG capsule Take 250 mg by mouth daily.        Marland Kitchen levothyroxine (SYNTHROID, LEVOTHROID) 100 MCG tablet Take 100 mcg by mouth daily.        . Multiple Vitamin (MULTIVITAMIN) tablet Take 1 tablet by mouth daily.        Marland Kitchen omeprazole (PRILOSEC) 40 MG capsule Take 1 capsule (40 mg total) by mouth daily.  90 capsule  3  . losartan (COZAAR) 50 MG tablet Take 50 mg by mouth daily.      . polyethylene glycol (MIRALAX / GLYCOLAX) packet Take 17 g by mouth as needed.       Marland Kitchen VITAMIN D, CHOLECALCIFEROL, PO Take 1 tablet by mouth daily.         No current facility-administered medications on file prior to visit.    Review of Systems:  As per HPI, otherwise negative.    Physical  Examination: Filed Vitals:   09/23/12 1426  BP: 144/82  Pulse: 84  Temp: 98.7 F (37.1 C)  Resp: 18   Filed Vitals:   09/23/12 1426  Height: 5\' 10"  (1.778 m)  Weight: 175 lb (79.379 kg)   Body mass index is 25.11 kg/(m^2). Ideal Body Weight: Weight in (lb) to have BMI = 25: 173.9  GEN: WDWN, NAD, Non-toxic, A & O x 3 HEENT: Atraumatic, Normocephalic. Neck supple. No masses, No LAD. Ears and Nose: No external deformity. CV: RRR, No M/G/R. No JVD. No thrill. No extra heart sounds. PULM: CTA B, no wheezes, crackles, rhonchi. No retractions. No resp. distress. No accessory muscle use. ABD: S, NT, ND, +BS. No rebound. No HSM. EXTR: No c/c/e NEURO Normal gait.  PSYCH: Normally interactive. Conversant. Not depressed or anxious appearing.  Calm demeanor.    Assessment and Plan: Bronchitis tussionex zpack mucinex d   Signed,  Phillips Odor, MD

## 2013-07-14 ENCOUNTER — Ambulatory Visit (INDEPENDENT_AMBULATORY_CARE_PROVIDER_SITE_OTHER): Payer: Medicare Other | Admitting: Internal Medicine

## 2013-07-14 VITALS — BP 132/80 | HR 88 | Temp 98.2°F | Resp 16 | Ht 68.0 in | Wt 172.8 lb

## 2013-07-14 DIAGNOSIS — L259 Unspecified contact dermatitis, unspecified cause: Secondary | ICD-10-CM

## 2013-07-14 MED ORDER — METHYLPREDNISOLONE ACETATE 80 MG/ML IJ SUSP
80.0000 mg | Freq: Once | INTRAMUSCULAR | Status: AC
  Administered 2013-07-14: 80 mg via INTRAMUSCULAR

## 2013-07-14 NOTE — Progress Notes (Signed)
Subjective:    Patient ID: Kristina Anthony, female    DOB: Nov 20, 1945, 68 y.o.   MRN: [id number]  HPI This chart was scribed for Kristina Lin, MD by Thea Alken, ED Scribe. This patient was seen in room 8 and the patient's care was started at 7:10 PM.  HPI Comments: Kristina Anthony is a 68 y.o. female who [facility] complaining of an allergic reaction onset 2 days. Pt states that about a week ago she used a new eye makeup remover and it burned the skin around her eyes a couple days later. She states that her eyes were almost swollen shut in the mornings. She has been experiencing burning, itchiness, redness, and swelling around her eyes. She reports that she has tried coconut oil as well as benadryl with no relief. She reports she does not like taking prednisone.   Past Medical History  Diagnosis Date  . Adenomatous colon polyp 03/2007  . Diverticulosis   . Chronic constipation   . Hypothyroidism   . Anxiety   . Alcohol abuse   . Esophageal stricture   . GERD (gastroesophageal reflux disease)   . Depression   . Hemorrhoids   . Substance abuse   . Allergy   . Arthritis   . Cancer     SKIN-REMOVED  . Hyperlipidemia   . Hypertension    No Known Allergies Prior to Admission medications   Medication Sig Start Date End Date Taking? Authorizing Provider  calcium carbonate 200 MG capsule Take 250 mg by mouth daily.     Yes Historical Provider, MD  levothyroxine (SYNTHROID, LEVOTHROID) 100 MCG tablet Take 100 mcg by mouth daily.     Yes Historical Provider, MD  Multiple Vitamin (MULTIVITAMIN) tablet Take 1 tablet by mouth daily.     Yes Historical Provider, MD  Atorvastatin Calcium (LIPITOR PO) Take 1 tablet by mouth daily.    Historical Provider, MD  azithromycin (ZITHROMAX) 250 MG tablet Take 2 tabs PO x 1 dose, then 1 tab PO QD x 4 days 09/23/12   Ellison Carwin, MD  chlorpheniramine-HYDROcodone [facility] PENNKINETIC ER) 10-8 MG/5ML LQCR Take  5 mLs by mouth every 12 (twelve) hours as needed. 09/23/12   Ellison Carwin, MD  losartan (COZAAR) 50 MG tablet Take 50 mg by mouth daily.    Historical Provider, MD  omeprazole (PRILOSEC) 40 MG capsule Take 1 capsule (40 mg total) by mouth daily. 07/09/12   Ladene Artist, MD  polyethylene glycol [facility] / Floria Raveling) packet Take 17 g by mouth as needed.     Historical Provider, MD  VITAMIN D, CHOLECALCIFEROL, PO Take 1 tablet by mouth daily.      Historical Provider, MD    Review of Systems  Eyes: Positive for pain ( burning  swelling), redness and itching.       Objective:   Physical Exam  Constitutional: She is oriented to person, place, and time. She appears well-developed and well-nourished.  HENT:  Head: Atraumatic.  Right Ear: External ear normal.  Left Ear: External ear normal.  Eyes: Conjunctivae are normal. Pupils are equal, round, and reactive to light.  Neck: Normal range of motion.  Cardiovascular: Normal rate and regular rhythm.   Pulmonary/Chest: Effort normal and breath sounds normal.  Musculoskeletal: Normal range of motion.  Neurological: She is alert and oriented to person, place, and time.  Skin:  Marked erythema with mild swelling periorbital without eye involvement   Filed Vitals:  07/14/13 1811  BP: 132/80  Pulse: 88  Temp: 98.2 F (36.8 C)  Resp: 16      Assessment & Plan:  I have reviewed and agree with documentation. [name] P. Laney Pastor, M.D.  Contact dermatitis - Plan: methylPREDNISolone acetate (DEPO-MEDROL) injection 80 mg  Meds ordered this encounter  Medications  . methylPREDNISolone acetate (DEPO-MEDROL) injection 80 mg    Sig:    OTC antihistamines Skin moisturizer Old 1 week if not well

## 2013-07-25 DIAGNOSIS — Z1231 Encounter for screening mammogram for malignant neoplasm of breast: Secondary | ICD-10-CM | POA: Diagnosis not present

## 2013-10-15 DIAGNOSIS — H04129 Dry eye syndrome of unspecified lacrimal gland: Secondary | ICD-10-CM | POA: Diagnosis not present

## 2013-10-15 DIAGNOSIS — H40029 Open angle with borderline findings, high risk, unspecified eye: Secondary | ICD-10-CM | POA: Diagnosis not present

## 2013-10-15 DIAGNOSIS — H2510 Age-related nuclear cataract, unspecified eye: Secondary | ICD-10-CM | POA: Diagnosis not present

## 2013-10-15 DIAGNOSIS — H524 Presbyopia: Secondary | ICD-10-CM | POA: Diagnosis not present

## 2013-11-07 DIAGNOSIS — H40029 Open angle with borderline findings, high risk, unspecified eye: Secondary | ICD-10-CM | POA: Diagnosis not present

## 2013-11-28 DIAGNOSIS — M5137 Other intervertebral disc degeneration, lumbosacral region: Secondary | ICD-10-CM | POA: Diagnosis not present

## 2013-11-28 DIAGNOSIS — M999 Biomechanical lesion, unspecified: Secondary | ICD-10-CM | POA: Diagnosis not present

## 2013-12-05 DIAGNOSIS — M5137 Other intervertebral disc degeneration, lumbosacral region: Secondary | ICD-10-CM | POA: Diagnosis not present

## 2013-12-05 DIAGNOSIS — M999 Biomechanical lesion, unspecified: Secondary | ICD-10-CM | POA: Diagnosis not present

## 2013-12-09 DIAGNOSIS — M999 Biomechanical lesion, unspecified: Secondary | ICD-10-CM | POA: Diagnosis not present

## 2013-12-09 DIAGNOSIS — M5137 Other intervertebral disc degeneration, lumbosacral region: Secondary | ICD-10-CM | POA: Diagnosis not present

## 2013-12-12 DIAGNOSIS — M999 Biomechanical lesion, unspecified: Secondary | ICD-10-CM | POA: Diagnosis not present

## 2013-12-12 DIAGNOSIS — M5137 Other intervertebral disc degeneration, lumbosacral region: Secondary | ICD-10-CM | POA: Diagnosis not present

## 2013-12-16 DIAGNOSIS — R7989 Other specified abnormal findings of blood chemistry: Secondary | ICD-10-CM | POA: Diagnosis not present

## 2013-12-16 DIAGNOSIS — I10 Essential (primary) hypertension: Secondary | ICD-10-CM | POA: Diagnosis not present

## 2013-12-18 DIAGNOSIS — M5137 Other intervertebral disc degeneration, lumbosacral region: Secondary | ICD-10-CM | POA: Diagnosis not present

## 2013-12-18 DIAGNOSIS — M999 Biomechanical lesion, unspecified: Secondary | ICD-10-CM | POA: Diagnosis not present

## 2013-12-23 DIAGNOSIS — R6889 Other general symptoms and signs: Secondary | ICD-10-CM | POA: Diagnosis not present

## 2013-12-23 DIAGNOSIS — D7589 Other specified diseases of blood and blood-forming organs: Secondary | ICD-10-CM | POA: Diagnosis not present

## 2013-12-23 DIAGNOSIS — E039 Hypothyroidism, unspecified: Secondary | ICD-10-CM | POA: Diagnosis not present

## 2013-12-23 DIAGNOSIS — Z0000 Encounter for general adult medical examination without abnormal findings: Secondary | ICD-10-CM | POA: Diagnosis not present

## 2013-12-25 DIAGNOSIS — M999 Biomechanical lesion, unspecified: Secondary | ICD-10-CM | POA: Diagnosis not present

## 2013-12-25 DIAGNOSIS — M5137 Other intervertebral disc degeneration, lumbosacral region: Secondary | ICD-10-CM | POA: Diagnosis not present

## 2014-03-11 DIAGNOSIS — M899 Disorder of bone, unspecified: Secondary | ICD-10-CM | POA: Diagnosis not present

## 2014-03-11 DIAGNOSIS — Z124 Encounter for screening for malignant neoplasm of cervix: Secondary | ICD-10-CM | POA: Diagnosis not present

## 2014-03-11 DIAGNOSIS — Z01419 Encounter for gynecological examination (general) (routine) without abnormal findings: Secondary | ICD-10-CM | POA: Diagnosis not present

## 2014-03-11 DIAGNOSIS — Z8041 Family history of malignant neoplasm of ovary: Secondary | ICD-10-CM | POA: Diagnosis not present

## 2014-03-11 DIAGNOSIS — Z808 Family history of malignant neoplasm of other organs or systems: Secondary | ICD-10-CM | POA: Diagnosis not present

## 2014-03-11 DIAGNOSIS — R6889 Other general symptoms and signs: Secondary | ICD-10-CM | POA: Diagnosis not present

## 2014-03-11 DIAGNOSIS — M949 Disorder of cartilage, unspecified: Secondary | ICD-10-CM | POA: Diagnosis not present

## 2014-03-26 DIAGNOSIS — Z8542 Personal history of malignant neoplasm of other parts of uterus: Secondary | ICD-10-CM | POA: Diagnosis not present

## 2014-03-26 DIAGNOSIS — Z808 Family history of malignant neoplasm of other organs or systems: Secondary | ICD-10-CM | POA: Diagnosis not present

## 2014-03-26 DIAGNOSIS — Z803 Family history of malignant neoplasm of breast: Secondary | ICD-10-CM | POA: Diagnosis not present

## 2014-03-26 DIAGNOSIS — N959 Unspecified menopausal and perimenopausal disorder: Secondary | ICD-10-CM | POA: Diagnosis not present

## 2014-03-26 DIAGNOSIS — Z800 Family history of malignant neoplasm of digestive organs: Secondary | ICD-10-CM | POA: Diagnosis not present

## 2014-03-26 DIAGNOSIS — Z8049 Family history of malignant neoplasm of other genital organs: Secondary | ICD-10-CM | POA: Diagnosis not present

## 2014-04-24 DIAGNOSIS — Z8542 Personal history of malignant neoplasm of other parts of uterus: Secondary | ICD-10-CM | POA: Diagnosis not present

## 2014-04-24 DIAGNOSIS — Z803 Family history of malignant neoplasm of breast: Secondary | ICD-10-CM | POA: Diagnosis not present

## 2014-04-24 DIAGNOSIS — Z809 Family history of malignant neoplasm, unspecified: Secondary | ICD-10-CM | POA: Diagnosis not present

## 2014-04-24 DIAGNOSIS — Z800 Family history of malignant neoplasm of digestive organs: Secondary | ICD-10-CM | POA: Diagnosis not present

## 2014-04-28 ENCOUNTER — Other Ambulatory Visit: Payer: Self-pay | Admitting: *Deleted

## 2014-04-28 DIAGNOSIS — I83893 Varicose veins of bilateral lower extremities with other complications: Secondary | ICD-10-CM

## 2014-05-05 ENCOUNTER — Encounter: Payer: Self-pay | Admitting: Vascular Surgery

## 2014-05-06 ENCOUNTER — Ambulatory Visit (INDEPENDENT_AMBULATORY_CARE_PROVIDER_SITE_OTHER): Payer: Medicare Other | Admitting: Vascular Surgery

## 2014-05-06 ENCOUNTER — Ambulatory Visit (HOSPITAL_COMMUNITY)
Admission: RE | Admit: 2014-05-06 | Discharge: 2014-05-06 | Disposition: A | Discharge status: Home / Self Care | Payer: Medicare Other | Source: Ambulatory Visit | Attending: Vascular Surgery | Admitting: Vascular Surgery

## 2014-05-06 ENCOUNTER — Encounter: Payer: Self-pay | Admitting: Vascular Surgery

## 2014-05-06 VITALS — BP 163/80 | HR 67 | Resp 16 | Ht 69.0 in | Wt 158.0 lb

## 2014-05-06 DIAGNOSIS — I83891 Varicose veins of right lower extremities with other complications: Secondary | ICD-10-CM

## 2014-05-06 DIAGNOSIS — I83812 Varicose veins of left lower extremities with pain: Secondary | ICD-10-CM

## 2014-05-06 DIAGNOSIS — I83899 Varicose veins of unspecified lower extremities with other complications: Secondary | ICD-10-CM | POA: Insufficient documentation

## 2014-05-06 DIAGNOSIS — I83893 Varicose veins of bilateral lower extremities with other complications: Secondary | ICD-10-CM | POA: Diagnosis not present

## 2014-05-06 NOTE — Progress Notes (Signed)
Subjective:     Patient ID: Kristina Anthony, female   DOB: 21-Aug-1945, 68 y.o.   MRN: [id number]  HPI this 68 year old female is evaluated for painful varicosities in the right leg. She has had these varicosities for many years-greater than 5 and the size has increased significantly as has the symptomatology. She now complains of aching throbbing heavy feeling as she stands on her feet all day. She does not really elastic compression stockings nor elevate her legs on a regular basis. She has no history of DVT thrombophlebitis stasis ulcers bleeding but does have some edema as the day progresses. She has no symptoms in the contralateral left leg.  Past Medical History  Diagnosis Date  . Adenomatous colon polyp 03/2007  . Diverticulosis   . Chronic constipation   . Hypothyroidism   . Anxiety   . Alcohol abuse   . Esophageal stricture   . GERD (gastroesophageal reflux disease)   . Depression   . Hemorrhoids   . Substance abuse   . Allergy   . Arthritis   . Cancer     SKIN-REMOVED  . Hyperlipidemia   . Hypertension     History  Substance Use Topics  . Smoking status: Former Smoker    Types: Cigarettes  . Smokeless tobacco: Never Used  . Alcohol Use: Yes     Comment: 3-4 glasses per week-pt states she quit 2013     07/09/2012/sd    Family History  Problem Relation Age of Onset  . Colon cancer Mother     at age 77  . Prostate cancer Father   . Colon polyps Brother   . Colon polyps Father   . Colon polyps Mother   . Heart disease Maternal Grandfather     No Known Allergies  Current outpatient prescriptions: calcium carbonate 200 MG capsule, Take 250 mg by mouth daily.  , Disp: , Rfl: ;  levothyroxine (SYNTHROID, LEVOTHROID) 100 MCG tablet, Take 100 mcg by mouth daily.  , Disp: , Rfl: ;  VITAMIN D, CHOLECALCIFEROL, PO, Take 1 tablet by mouth daily.  , Disp: , Rfl: ;  Atorvastatin Calcium (LIPITOR PO), Take 1 tablet by mouth daily., Disp: , Rfl:  azithromycin (ZITHROMAX) 250 MG  tablet, Take 2 tabs PO x 1 dose, then 1 tab PO QD x 4 days, Disp: 6 tablet, Rfl: 0;  chlorpheniramine-HYDROcodone (TUSSIONEX PENNKINETIC ER) 10-8 MG/5ML LQCR, Take 5 mLs by mouth every 12 (twelve) hours as needed., Disp: 60 mL, Rfl: 0;  losartan (COZAAR) 50 MG tablet, Take 50 mg by mouth daily., Disp: , Rfl: ;  Multiple Vitamin (MULTIVITAMIN) tablet, Take 1 tablet by mouth daily.  , Disp: , Rfl:  omeprazole (PRILOSEC) 40 MG capsule, Take 1 capsule (40 mg total) by mouth daily., Disp: 90 capsule, Rfl: 3;  polyethylene glycol (MIRALAX / GLYCOLAX) packet, Take 17 g by mouth as needed. , Disp: , Rfl:   BP 163/80 mmHg  Pulse 67  Resp 16  Ht 5\' 9"  (1.753 m)  Wt 158 lb (71.668 kg)  BMI 23.32 kg/m2  Body mass index is 23.32 kg/(m^2).          Review of SystemsDenies chest pain, dyspnea on exertion, PND, orthopnea, hemoptysis, lateralizing weakness, aphasia, patient does complain of some heartburn and chronic constipation. Other systems negative and a complete review of systems     Objective:   Physical Exam BP 163/80 mmHg  Pulse 67  Resp 16  Ht 5\' 9"  (1.753 m)  Wt 158  lb (71.668 kg)  BMI 23.32 kg/m2  Gen.-alert and oriented x3 in no apparent distress HEENT normal for age Lungs no rhonchi or wheezing Cardiovascular regular rhythm no murmurs carotid pulses 3+ palpable no bruits audible Abdomen soft nontender no palpable masses Musculoskeletal free of  major deformities Skin clear -no rashes Neurologic normal Lower extremities 3+ femoral and dorsalis pedis pulses palpable bilaterally with no edemaon left mild edema on right There is  large varix in right pretibial region which communicates with great saphenous vein below the knee and prominent tributary extending down pretibial region and lateral to this area. No active ulcer noted no hyperpigmentation.  Today I ordered a venous duplex exam of the right leg which I reviewed and interpreted. There is no DVT. There is gross reflux in the  right great saphenous vein from the knee to the saphenofemoral junction and this supplies the bulging varix which is symptomatic in in the pretibial region. There is also some deep vein reflux. The left leg is unremarkable.     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

## 2014-07-25 DIAGNOSIS — M272 Inflammatory conditions of jaws: Secondary | ICD-10-CM | POA: Diagnosis not present

## 2014-07-25 DIAGNOSIS — K045 Chronic apical periodontitis: Secondary | ICD-10-CM | POA: Diagnosis not present

## 2014-08-05 DIAGNOSIS — Z1231 Encounter for screening mammogram for malignant neoplasm of breast: Secondary | ICD-10-CM | POA: Diagnosis not present

## 2014-08-11 ENCOUNTER — Encounter: Payer: Self-pay | Admitting: Vascular Surgery

## 2014-08-12 ENCOUNTER — Encounter: Payer: Self-pay | Admitting: Vascular Surgery

## 2014-08-12 ENCOUNTER — Other Ambulatory Visit: Payer: Self-pay | Admitting: *Deleted

## 2014-08-12 ENCOUNTER — Ambulatory Visit (INDEPENDENT_AMBULATORY_CARE_PROVIDER_SITE_OTHER): Payer: Medicare Other | Admitting: Vascular Surgery

## 2014-08-12 VITALS — BP 145/86 | HR 65 | Resp 16 | Ht 69.0 in | Wt 158.0 lb

## 2014-08-12 DIAGNOSIS — I83891 Varicose veins of right lower extremities with other complications: Secondary | ICD-10-CM

## 2014-08-12 NOTE — Progress Notes (Signed)
Subjective:     Patient ID: Kristina Anthony, female   DOB: 02-15-1946, 69 y.o.   MRN: [id number]  HPI this 69 year old female returns for continued follow-up regarding her painful varicosities in the right leg. She has tried long-leg elastic compression stockings 20-30 millimeter gradient as well as elevation and ibuprofen and continues to have aching throbbing and burning discomfort in the right leg particularly below the knee in the pretibial region where there is a large bulging varix. She also develops edema in the ankle as the day progresses. Her symptoms are continuing to worsen and are affecting her daily living. She has no history of DVT or stasis ulcers.  Past Medical History  Diagnosis Date  . Adenomatous colon polyp 03/2007  . Diverticulosis   . Chronic constipation   . Hypothyroidism   . Anxiety   . Alcohol abuse   . Esophageal stricture   . GERD (gastroesophageal reflux disease)   . Depression   . Hemorrhoids   . Substance abuse   . Allergy   . Arthritis   . Cancer     SKIN-REMOVED  . Hyperlipidemia   . Hypertension     History  Substance Use Topics  . Smoking status: Former Smoker    Types: Cigarettes  . Smokeless tobacco: Never Used  . Alcohol Use: Yes     Comment: 3-4 glasses per week-pt states she quit 2013     07/09/2012/sd    Family History  Problem Relation Age of Onset  . Colon cancer Mother     at age 25  . Prostate cancer Father   . Colon polyps Brother   . Colon polyps Father   . Colon polyps Mother   . Heart disease Maternal Grandfather     No Known Allergies   Current outpatient prescriptions:  .  levothyroxine (SYNTHROID, LEVOTHROID) 100 MCG tablet, Take 100 mcg by mouth daily.  , Disp: , Rfl:  .  Atorvastatin Calcium (LIPITOR PO), Take 1 tablet by mouth daily., Disp: , Rfl:  .  azithromycin (ZITHROMAX) 250 MG tablet, Take 2 tabs PO x 1 dose, then 1 tab PO QD x 4 days (Patient not taking: Reported on 08/12/2014), Disp: 6 tablet, Rfl: 0 .   calcium carbonate 200 MG capsule, Take 250 mg by mouth daily.  , Disp: , Rfl:  .  chlorpheniramine-HYDROcodone (TUSSIONEX PENNKINETIC ER) 10-8 MG/5ML LQCR, Take 5 mLs by mouth every 12 (twelve) hours as needed. (Patient not taking: Reported on 08/12/2014), Disp: 60 mL, Rfl: 0 .  losartan (COZAAR) 50 MG tablet, Take 50 mg by mouth daily., Disp: , Rfl:  .  Multiple Vitamin (MULTIVITAMIN) tablet, Take 1 tablet by mouth daily.  , Disp: , Rfl:  .  omeprazole (PRILOSEC) 40 MG capsule, Take 1 capsule (40 mg total) by mouth daily. (Patient not taking: Reported on 08/12/2014), Disp: 90 capsule, Rfl: 3 .  polyethylene glycol (MIRALAX / GLYCOLAX) packet, Take 17 g by mouth as needed. , Disp: , Rfl:  .  VITAMIN D, CHOLECALCIFEROL, PO, Take 1 tablet by mouth daily.  , Disp: , Rfl:   BP 145/86 mmHg  Pulse 65  Resp 16  Ht 5\' 9"  (1.753 m)  Wt 158 lb (71.668 kg)  BMI 23.32 kg/m2  Body mass index is 23.32 kg/(m^2).           Review of Systems denies chest pain, dyspnea on exertion, PND, orthopnea, hemoptysis, claudication.     Objective:   Physical Exam BP 145/86 mmHg  Pulse 65  Resp 16  Ht 5\' 9"  (1.753 m)  Wt 158 lb (71.668 kg)  BMI 23.32 kg/m2  Gen. well-developed well-nourished female no apparent stress alert and oriented 3 Lungs no rhonchi or wheezing Right leg with large bulging varix in the pretibial region with secondary varix extending down the tibia toward the ankle area with 1+ edema. 3+ to Salas pedis pulse palpable.  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 Then she will return for evaluation of possible stab phlebectomy 10-20 and possible sclerotherapy We will proceed with precertification to perform this in the near future

## 2014-09-11 ENCOUNTER — Encounter: Payer: Self-pay | Admitting: Vascular Surgery

## 2014-09-15 ENCOUNTER — Encounter: Payer: Self-pay | Admitting: Vascular Surgery

## 2014-09-15 ENCOUNTER — Ambulatory Visit (INDEPENDENT_AMBULATORY_CARE_PROVIDER_SITE_OTHER): Payer: Medicare Other | Admitting: Vascular Surgery

## 2014-09-15 VITALS — BP 142/79 | HR 79 | Resp 14 | Ht 69.0 in | Wt 160.0 lb

## 2014-09-15 DIAGNOSIS — I83891 Varicose veins of right lower extremities with other complications: Secondary | ICD-10-CM | POA: Diagnosis not present

## 2014-09-15 DIAGNOSIS — I83899 Varicose veins of unspecified lower extremities with other complications: Secondary | ICD-10-CM | POA: Insufficient documentation

## 2014-09-15 NOTE — Progress Notes (Signed)
Laser Ablation Procedure    Date: 09/15/2014   Kristina Anthony DOB:[birth year]  Consent signed: Yes    Surgeon:  Dr. Nelda Severe. Kellie Simmering  Procedure: Laser Ablation: right Greater Saphenous Vein  BP 142/79 mmHg  Pulse 79  Resp 14  Ht 5\' 9"  (1.753 m)  Wt 160 lb (72.576 kg)  BMI 23.62 kg/m2  Tumescent Anesthesia: 375 cc 0.9% NaCl with 50 cc Lidocaine HCL with 1% Epi and 15 cc 8.4% NaHCO3  Local Anesthesia: 6 cc Lidocaine HCL and NaHCO3 (ratio 2:1)  Pulsed Mode: 15 watts, 52ms delay, 1.0 duration  Total Energy:  2239            Total Pulses:    151            Total Time: 2:30    Patient tolerated procedure well  Notes:   Description of Procedure:  After marking the course of the secondary varicosities, the patient was placed on the operating table in the supine position, and the right leg was prepped and draped in sterile fashion.   Local anesthetic was administered and under ultrasound guidance the saphenous vein was accessed with a micro needle and guide wire; then the mirco puncture sheath was placed.  A guide wire was inserted saphenofemoral junction , followed by a 5 french sheath.  The position of the sheath and then the laser fiber below the junction was confirmed using the ultrasound.  Tumescent anesthesia was administered along the course of the saphenous vein using ultrasound guidance. The patient was placed in Trendelenburg position and protective laser glasses were placed on patient and staff, and the laser was fired at 15 watts continuous mode advancing 1-9mm/second for a total of 2239 joules.     Steri strips were applied to the stab wounds and ABD pads and thigh high compression stockings were applied.  Ace wrap bandages were applied over the phlebectomy sites and at the top of the saphenofemoral junction. Blood loss was less than 15 cc.  The patient ambulated out of the operating room having tolerated the procedure well.

## 2014-09-15 NOTE — Progress Notes (Signed)
Subjective:     Patient ID: Kristina Anthony, female   DOB: 10-24-1945, 69 y.o.   MRN: [id number]  HPI this 69 year old female had laser ablation of the right great saphenous vein performed under local tumescent anesthesia for venous reflux with pain and swelling and bulging varicosities. She tolerated the procedure well. A total of 2230 J of energy was utilized. Review of Systems     Objective:   Physical Exam BP 142/79 mmHg  Pulse 79  Resp 14  Ht 5\' 9"  (1.753 m)  Wt 160 lb (72.576 kg)  BMI 23.62 kg/m2       Assessment:     Well-tolerated laser ablation right great saphenous vein performed under local tumescent anesthesia    Plan:     Return in one week for venous duplex exam to confirm closure right great saphenous vein Patient will then return in 3 months to see if stab phlebectomy of painful varicosities and possible sclerotherapy will be indicated

## 2014-09-16 ENCOUNTER — Telehealth: Payer: Self-pay | Admitting: *Deleted

## 2014-09-16 NOTE — Telephone Encounter (Signed)
Pt states she had a good night. Only a little pain at the top of her leg. Following all instructions. In good spirits.

## 2014-09-17 ENCOUNTER — Encounter: Payer: Self-pay | Admitting: Vascular Surgery

## 2014-09-22 ENCOUNTER — Encounter: Payer: Self-pay | Admitting: Vascular Surgery

## 2014-09-23 ENCOUNTER — Encounter (HOSPITAL_COMMUNITY): Payer: Medicare Other

## 2014-09-23 ENCOUNTER — Ambulatory Visit: Payer: Medicare Other | Admitting: Vascular Surgery

## 2014-09-23 ENCOUNTER — Ambulatory Visit (INDEPENDENT_AMBULATORY_CARE_PROVIDER_SITE_OTHER): Payer: Medicare Other | Admitting: Vascular Surgery

## 2014-09-23 ENCOUNTER — Encounter: Payer: Self-pay | Admitting: Vascular Surgery

## 2014-09-23 ENCOUNTER — Ambulatory Visit (HOSPITAL_COMMUNITY)
Admission: RE | Admit: 2014-09-23 | Discharge: 2014-09-23 | Disposition: A | Discharge status: Home / Self Care | Payer: Medicare Other | Source: Ambulatory Visit | Attending: Vascular Surgery | Admitting: Vascular Surgery

## 2014-09-23 VITALS — BP 139/84 | HR 70 | Resp 14

## 2014-09-23 DIAGNOSIS — I83891 Varicose veins of right lower extremities with other complications: Secondary | ICD-10-CM

## 2014-09-23 NOTE — Progress Notes (Signed)
Subjective:     Patient ID: Kristina Anthony, female   DOB: 06/20/1946, 69 y.o.   MRN: [id number]  HPI this 69 year old female returns 1 week post laser ablation right great saphenous vein from proximal calf to near the saphenofemoral junction for gross reflux with painful varicosities. She states the leg feels much better below the knee with less tenseness of the varicosities. She has no distal edema. She has worn her elastic compression stockings and taken ibuprofen as instructed.  Past Medical History  Diagnosis Date  . Adenomatous colon polyp 03/2007  . Diverticulosis   . Chronic constipation   . Hypothyroidism   . Anxiety   . Alcohol abuse   . Esophageal stricture   . GERD (gastroesophageal reflux disease)   . Depression   . Hemorrhoids   . Substance abuse   . Allergy   . Arthritis   . Cancer     SKIN-REMOVED  . Hyperlipidemia   . Hypertension     History  Substance Use Topics  . Smoking status: Former Smoker    Types: Cigarettes  . Smokeless tobacco: Never Used  . Alcohol Use: 0.0 oz/week    0 Standard drinks or equivalent per week     Comment: 3-4 glasses per week-pt states she quit 2013     07/09/2012/sd    Family History  Problem Relation Age of Onset  . Colon cancer Mother     at age 47  . Prostate cancer Father   . Colon polyps Brother   . Colon polyps Father   . Colon polyps Mother   . Heart disease Maternal Grandfather     No Known Allergies   Current outpatient prescriptions:  .  Atorvastatin Calcium (LIPITOR PO), Take 1 tablet by mouth daily., Disp: , Rfl:  .  calcium carbonate 200 MG capsule, Take 250 mg by mouth daily.  , Disp: , Rfl:  .  levothyroxine (SYNTHROID, LEVOTHROID) 100 MCG tablet, Take 100 mcg by mouth daily.  , Disp: , Rfl:  .  losartan (COZAAR) 50 MG tablet, Take 50 mg by mouth daily., Disp: , Rfl:  .  Multiple Vitamin (MULTIVITAMIN) tablet, Take 1 tablet by mouth daily.  , Disp: , Rfl:  .  polyethylene glycol (MIRALAX / GLYCOLAX)  packet, Take 17 g by mouth as needed. , Disp: , Rfl:  .  VITAMIN D, CHOLECALCIFEROL, PO, Take 1 tablet by mouth daily.  , Disp: , Rfl:  .  azithromycin (ZITHROMAX) 250 MG tablet, Take 2 tabs PO x 1 dose, then 1 tab PO QD x 4 days (Patient not taking: Reported on 08/12/2014), Disp: 6 tablet, Rfl: 0 .  chlorpheniramine-HYDROcodone (TUSSIONEX PENNKINETIC ER) 10-8 MG/5ML LQCR, Take 5 mLs by mouth every 12 (twelve) hours as needed. (Patient not taking: Reported on 08/12/2014), Disp: 60 mL, Rfl: 0 .  omeprazole (PRILOSEC) 40 MG capsule, Take 1 capsule (40 mg total) by mouth daily. (Patient not taking: Reported on 08/12/2014), Disp: 90 capsule, Rfl: 3  Filed Vitals:   09/23/14 1502  BP: 139/84  Pulse: 70  Resp: 14    There is no weight on file to calculate BMI.           Review of Systems denies chest pain, dyspnea on exertion, PND, orthopnea, hemoptysis.    Objective:   Physical Exam BP 139/84 mmHg  Pulse 70  Resp 14  Gen. well-developed well-nourished female in no apparent stress alert and oriented 3 Lungs no rhonchi or wheezing Cardiovascular regular rhythm  no murmurs Right leg with mild tenderness to palpation from proximal calf to near the saphenofemoral junction over great saphenous vein. No blistering or erythema noted. Varicosity and right pretibial region is less tense with no distal edema noted. 3+ dorsalis pedis pulse palpable.  Today I ordered a venous duplex exam of the right leg which I reviewed and interpreted. There is no DVT. There is total closure of the great saphenous vein from the entry site in the proximal calf to near the saphenofemoral junction.     Assessment:     Successful laser ablation right great saphenous vein for gross reflux with painful varicosities    Plan:     Return in 3 months to see if stab phlebectomy or sclerotherapy or combination will be necessary to complete procedure

## 2014-12-26 ENCOUNTER — Encounter: Payer: Self-pay | Admitting: Vascular Surgery

## 2014-12-30 ENCOUNTER — Encounter: Payer: Self-pay | Admitting: Vascular Surgery

## 2014-12-30 ENCOUNTER — Ambulatory Visit (INDEPENDENT_AMBULATORY_CARE_PROVIDER_SITE_OTHER): Payer: Medicare Other | Admitting: Vascular Surgery

## 2014-12-30 VITALS — BP 137/83 | HR 72 | Resp 14

## 2014-12-30 DIAGNOSIS — I83891 Varicose veins of right lower extremities with other complications: Secondary | ICD-10-CM

## 2014-12-30 NOTE — Progress Notes (Signed)
Subjective:     Patient ID: Kristina Anthony, female   DOB: [birth year], 69 y.o.   MRN: [id number]  HPI this 69 year old female returns for further follow-up regarding her painful varicosities in the right leg. She underwent laser ablation right great saphenous vein 3 months ago. She has had decreasing swelling in the right ankle but continues to have some pretibial varicosities in the posterior calf varicosities which are painful. This has not been relieved by last to compression stockings or elevation. It is affecting her daily living.  Past Medical History  Diagnosis Date  . Adenomatous colon polyp 03/2007  . Diverticulosis   . Chronic constipation   . Hypothyroidism   . Anxiety   . Alcohol abuse   . Esophageal stricture   . GERD (gastroesophageal reflux disease)   . Depression   . Hemorrhoids   . Substance abuse   . Allergy   . Arthritis   . Cancer     SKIN-REMOVED  . Hyperlipidemia   . Hypertension     History  Substance Use Topics  . Smoking status: Former Smoker    Types: Cigarettes  . Smokeless tobacco: Never Used  . Alcohol Use: 0.0 oz/week    0 Standard drinks or equivalent per week     Comment: 3-4 glasses per week-pt states she quit 2013     07/09/2012/sd    Family History  Problem Relation Age of Onset  . Colon cancer Mother     at age 39  . Prostate cancer Father   . Colon polyps Brother   . Colon polyps Father   . Colon polyps Mother   . Heart disease Maternal Grandfather     No Known Allergies   Current outpatient prescriptions:  .  levothyroxine (SYNTHROID, LEVOTHROID) 100 MCG tablet, Take 100 mcg by mouth daily.  , Disp: , Rfl:  .  Atorvastatin Calcium (LIPITOR PO), Take 1 tablet by mouth daily., Disp: , Rfl:  .  azithromycin (ZITHROMAX) 250 MG tablet, Take 2 tabs PO x 1 dose, then 1 tab PO QD x 4 days (Patient not taking: Reported on 08/12/2014), Disp: 6 tablet, Rfl: 0 .  calcium carbonate 200 MG capsule, Take 250 mg by mouth daily.  , Disp: , Rfl:   .  chlorpheniramine-HYDROcodone (TUSSIONEX PENNKINETIC ER) 10-8 MG/5ML LQCR, Take 5 mLs by mouth every 12 (twelve) hours as needed. (Patient not taking: Reported on 08/12/2014), Disp: 60 mL, Rfl: 0 .  losartan (COZAAR) 50 MG tablet, Take 50 mg by mouth daily., Disp: , Rfl:  .  Multiple Vitamin (MULTIVITAMIN) tablet, Take 1 tablet by mouth daily.  , Disp: , Rfl:  .  omeprazole (PRILOSEC) 40 MG capsule, Take 1 capsule (40 mg total) by mouth daily. (Patient not taking: Reported on 08/12/2014), Disp: 90 capsule, Rfl: 3 .  VITAMIN D, CHOLECALCIFEROL, PO, Take 1 tablet by mouth daily.  , Disp: , Rfl:   Filed Vitals:   12/30/14 1513  BP: 137/83  Pulse: 72  Resp: 14    There is no weight on file to calculate BMI.           Review of Systems denies chest pain, dyspnea on exertion, PND, orthopnea, hemoptysis, claudication.     Objective:   Physical Exam BP 137/83 mmHg  Pulse 72  Resp 14  Gen. well-developed well-nourished female in no apparent distress alert and oriented 3 Lungs no rhonchi or wheezing Right leg with pretibial varicosities and posterior calf varicosities. No distal edema noted.  3+ dorsalis pedis pulse palpable.     Assessment:     Successful closure right great saphenous vein for painful varicosities with residual varicosities causing symptoms    Plan:     We'll schedule patient for multiple stab phlebectomy-10-20 next week to be followed by sclerotherapy in the next several weeks if necessary

## 2015-01-01 ENCOUNTER — Encounter: Payer: Self-pay | Admitting: Vascular Surgery

## 2015-01-05 ENCOUNTER — Encounter: Payer: Self-pay | Admitting: Vascular Surgery

## 2015-01-05 ENCOUNTER — Ambulatory Visit (INDEPENDENT_AMBULATORY_CARE_PROVIDER_SITE_OTHER): Payer: Medicare Other | Admitting: Vascular Surgery

## 2015-01-05 VITALS — BP 140/80 | HR 91 | Resp 16 | Ht 69.0 in | Wt 160.0 lb

## 2015-01-05 DIAGNOSIS — I83891 Varicose veins of right lower extremities with other complications: Secondary | ICD-10-CM | POA: Diagnosis not present

## 2015-01-05 NOTE — Progress Notes (Signed)
Filed Vitals:   01/05/15 1444 01/05/15 1445  BP: 141/83 140/80  Pulse: 91   Resp: 16   Height: 5\' 9"  (1.753 m)   Weight: 160 lb (72.576 kg)

## 2015-01-05 NOTE — Progress Notes (Signed)
    Stab Phlebectomy Procedure  Kristina Anthony DOB:1946-03-17  01/05/2015  Consent signed: Yes  Surgeon:J.D. Kellie Simmering  Procedure: stab phlebectomy: right leg  BP 140/80 mmHg  Pulse 91  Resp 16  Ht 5\' 9"  (1.753 m)  Wt 160 lb (72.576 kg)  BMI 23.62 kg/m2  Start time: 3:10   End time: 3:50   Tumescent Anesthesia: 60 cc 0.9% NaCl with 50 cc Lidocaine HCL with 1% Epi and 15 cc 8.4% NaHCO3  Local Anesthesia: 3 cc Lidocaine HCL and NaHCO3 (ratio 2:1)    Stab Phlebectomy: 10-20 Sites: Calf  Patient tolerated procedure well: Yes  Notes:   Description of Procedure:  After marking the course of the secondary varicosities, the patient was placed on the operating table in the supine position, and the right leg was prepped and draped in sterile fashion.    The patient was then put into Trendelenburg position.  Local anesthetic was administered at the previously marked varicosities, and tumescent anesthesia was administered around the vessels.  Ten to 20 stab wounds were made using the tip of an 11 blade. And using the vein hook, the phlebectomies were performed using a hemostat to avulse the varicosities.  Adequate hemostasis was achieved, and steri strips were applied to the stab wound.      ABD pads and thigh high compression stockings were applied as well ace wraps where needed. Blood loss was less than 15 cc.  The patient ambulated out of the operating room having tolerated the procedure well.

## 2015-01-05 NOTE — Progress Notes (Signed)
Subjective:     Patient ID: Kristina Anthony, female   DOB: [birth year], 69 y.o.   MRN: [id number]  HPIthis 69 year old female had multiple stab phlebectomy of painful varicosities in the right lower extremity performed under local tumescent anesthesia. She tolerated the procedure well.   Review of Systems     Objective:   Physical Exam BP 140/80 mmHg  Pulse 91  Resp 16  Ht 5\' 9"  (1.753 m)  Wt 160 lb (72.576 kg)  BMI 23.62 kg/m2       Assessment:     Well tolerated multiple stab phlebectomy (10-20) painful varicosities performed under local tumescent anesthesia     Plan:     Return in 2 months for final follow-up. She will receive sclerotherapy in the interim

## 2015-01-06 ENCOUNTER — Telehealth: Payer: Self-pay | Admitting: *Deleted

## 2015-01-06 NOTE — Telephone Encounter (Signed)
Pt doing well. No bleeding from stab phleb sites. Following all instructions. Sched her [phone] appt in Sept. And [facility] fu appt in Oct.

## 2015-01-07 ENCOUNTER — Encounter: Payer: Self-pay | Admitting: Vascular Surgery

## 2015-01-13 DIAGNOSIS — H40023 Open angle with borderline findings, high risk, bilateral: Secondary | ICD-10-CM | POA: Diagnosis not present

## 2015-01-13 DIAGNOSIS — H2513 Age-related nuclear cataract, bilateral: Secondary | ICD-10-CM | POA: Diagnosis not present

## 2015-01-13 DIAGNOSIS — H01003 Unspecified blepharitis right eye, unspecified eyelid: Secondary | ICD-10-CM | POA: Diagnosis not present

## 2015-01-13 DIAGNOSIS — H524 Presbyopia: Secondary | ICD-10-CM | POA: Diagnosis not present

## 2015-02-24 ENCOUNTER — Encounter: Payer: Self-pay | Admitting: *Deleted

## 2015-02-25 ENCOUNTER — Ambulatory Visit (INDEPENDENT_AMBULATORY_CARE_PROVIDER_SITE_OTHER): Payer: Medicare Other | Admitting: *Deleted

## 2015-02-25 DIAGNOSIS — I83893 Varicose veins of bilateral lower extremities with other complications: Secondary | ICD-10-CM

## 2015-02-25 DIAGNOSIS — I83891 Varicose veins of right lower extremities with other complications: Secondary | ICD-10-CM

## 2015-02-25 NOTE — Progress Notes (Signed)
X=.3% Sotradecol administered with a 27g butterfly.  Patient received a total of 3cc.  Patient's veins have really improved but still visible so I injected the front and back of her calf. Tol well. Will follow prn.  Photos: No.  Compression stockings applied: Yes.  and an ace

## 2015-03-06 ENCOUNTER — Emergency Department (HOSPITAL_COMMUNITY)
Admission: EM | Admit: 2015-03-06 | Discharge: 2015-03-06 | Disposition: A | Discharge status: Home / Self Care | Payer: Medicare Other | Attending: Emergency Medicine | Admitting: Emergency Medicine

## 2015-03-06 ENCOUNTER — Encounter (HOSPITAL_COMMUNITY): Payer: Self-pay | Admitting: Emergency Medicine

## 2015-03-06 DIAGNOSIS — Z8719 Personal history of other diseases of the digestive system: Secondary | ICD-10-CM | POA: Insufficient documentation

## 2015-03-06 DIAGNOSIS — Z79899 Other long term (current) drug therapy: Secondary | ICD-10-CM | POA: Insufficient documentation

## 2015-03-06 DIAGNOSIS — Z86018 Personal history of other benign neoplasm: Secondary | ICD-10-CM | POA: Insufficient documentation

## 2015-03-06 DIAGNOSIS — R61 Generalized hyperhidrosis: Secondary | ICD-10-CM | POA: Diagnosis not present

## 2015-03-06 DIAGNOSIS — Z85828 Personal history of other malignant neoplasm of skin: Secondary | ICD-10-CM | POA: Insufficient documentation

## 2015-03-06 DIAGNOSIS — Z8659 Personal history of other mental and behavioral disorders: Secondary | ICD-10-CM | POA: Diagnosis not present

## 2015-03-06 DIAGNOSIS — R197 Diarrhea, unspecified: Secondary | ICD-10-CM | POA: Diagnosis not present

## 2015-03-06 DIAGNOSIS — Z87891 Personal history of nicotine dependence: Secondary | ICD-10-CM | POA: Insufficient documentation

## 2015-03-06 DIAGNOSIS — E039 Hypothyroidism, unspecified: Secondary | ICD-10-CM | POA: Diagnosis not present

## 2015-03-06 DIAGNOSIS — Z8739 Personal history of other diseases of the musculoskeletal system and connective tissue: Secondary | ICD-10-CM | POA: Insufficient documentation

## 2015-03-06 DIAGNOSIS — R112 Nausea with vomiting, unspecified: Secondary | ICD-10-CM | POA: Insufficient documentation

## 2015-03-06 DIAGNOSIS — I10 Essential (primary) hypertension: Secondary | ICD-10-CM | POA: Diagnosis not present

## 2015-03-06 MED ORDER — ONDANSETRON HCL 4 MG/2ML IJ SOLN
4.0000 mg | Freq: Once | INTRAMUSCULAR | Status: AC
  Administered 2015-03-06: 4 mg via INTRAVENOUS
  Filled 2015-03-06: qty 2

## 2015-03-06 MED ORDER — SODIUM CHLORIDE 0.9 % IV BOLUS (SEPSIS)
1000.0000 mL | Freq: Once | INTRAVENOUS | Status: AC
  Administered 2015-03-06: 1000 mL via INTRAVENOUS

## 2015-03-06 MED ORDER — ONDANSETRON 4 MG PO TBDP: 4.0000 mg | ORAL_TABLET | Freq: Three times a day (TID) | ORAL | Status: DC | PRN

## 2015-03-06 NOTE — ED Notes (Signed)
Per EMS-ate some organic greens without washing them-symptoms occurred 1 hour after she ate them-positive response from Zofran 4 mg IV, 500 cc of NS

## 2015-03-06 NOTE — ED Provider Notes (Signed)
CSN: [id number]     Arrival date & time 03/06/15  1613 History   First MD Initiated Contact with Patient 03/06/15 1620     Chief Complaint  Patient presents with  . N/V/D      (Consider location/radiation/quality/duration/timing/severity/associated sxs/prior Treatment) HPI  As the patient is a 69 year old female, she has a history of chronic alcohol abuse, she was on her way to a treatment center today after being sober for 24 hours when she developed acute onset of nausea vomiting and then had watery diarrhea. This happen multiple times at the clinic, she was transported from there to the hospital by ambulance service who gave her Zofran and fluids and totally resolved her symptoms. She believes that she developed food poisoning one hour after eating an organic food that she did not clean appropriately before eating it. At this time she has no pain, minimal nausea, no headache, she denies recent travel or antibiotics.  Past Medical History  Diagnosis Date  . Adenomatous colon polyp 03/2007  . Diverticulosis   . Chronic constipation   . Hypothyroidism   . Anxiety   . Alcohol abuse   . Esophageal stricture   . GERD (gastroesophageal reflux disease)   . Depression   . Hemorrhoids   . Substance abuse   . Allergy   . Arthritis   . Cancer     SKIN-REMOVED  . Hyperlipidemia   . Hypertension    Past Surgical History  Procedure Laterality Date  . Eyelid repair w/ skin graft    . Tonsillectomy    . Colonoscopy     Family History  Problem Relation Age of Onset  . Colon cancer Mother     at age 13  . Prostate cancer Father   . Colon polyps Brother   . Colon polyps Father   . Colon polyps Mother   . Heart disease Maternal Grandfather    Social History  Substance Use Topics  . Smoking status: Former Smoker    Types: Cigarettes  . Smokeless tobacco: Never Used  . Alcohol Use: 0.0 oz/week    0 Standard drinks or equivalent per week     Comment: 3-4 glasses per week-pt states  she quit 2013     07/09/2012/sd   OB History    Gravida Para Term Preterm AB TAB SAB Ectopic Multiple Living   2 0             Review of Systems  All other systems reviewed and are negative.     Allergies  Review of patient's allergies indicates no known allergies.  Home Medications   Prior to Admission medications   Medication Sig Start Date End Date Taking? Authorizing Provider  Cyanocobalamin 200 MCG/SPRAY LIQD Take 1,000 mcg by mouth daily.   Yes Historical Provider, MD  levothyroxine (SYNTHROID, LEVOTHROID) 100 MCG tablet Take 100 mcg by mouth daily.     Yes Historical Provider, MD  Multiple Vitamin (MULTIVITAMIN) tablet Take 1 tablet by mouth daily.     Yes Historical Provider, MD  Vitamin D-Vitamin K (VITAMIN K2-VITAMIN D3 PO) Take 2 sprays by mouth daily.   Yes Historical Provider, MD  ondansetron (ZOFRAN ODT) 4 MG disintegrating tablet Take 1 tablet (4 mg total) by mouth every 8 (eight) hours as needed for nausea. 03/06/15   Noemi Chapel, MD   BP 126/71 mmHg  Pulse 75  Resp 16  SpO2 100% Physical Exam  Constitutional: She appears well-developed and well-nourished. No distress.  HENT:  Head:  Normocephalic and atraumatic.  Mouth/Throat: Oropharynx is clear and moist. No oropharyngeal exudate.  Eyes: Conjunctivae and EOM are normal. Pupils are equal, round, and reactive to light. Right eye exhibits no discharge. Left eye exhibits no discharge. No scleral icterus.  Neck: Normal range of motion. Neck supple. No JVD present. No thyromegaly present.  Cardiovascular: Normal rate, regular rhythm, normal heart sounds and intact distal pulses.  Exam reveals no gallop and no friction rub.   No murmur heard. Pulmonary/Chest: Effort normal and breath sounds normal. No respiratory distress. She has no wheezes. She has no rales.  Abdominal: Soft. Bowel sounds are normal. She exhibits no distension and no mass. There is no tenderness.  Musculoskeletal: Normal range of motion. She  exhibits no edema or tenderness.  Lymphadenopathy:    She has no cervical adenopathy.  Neurological: She is alert. Coordination normal.  Skin: Skin is warm and dry. No rash noted. No erythema.  Psychiatric: She has a normal mood and affect. Her behavior is normal.  Nursing note and vitals reviewed.   ED Course  Procedures (including critical care time) Labs Review Labs Reviewed - No data to display  Imaging Review No results found. I have personally reviewed and evaluated these images and lab results as part of my medical decision-making.    MDM   Final diagnoses:  Non-intractable vomiting with nausea, vomiting of unspecified type    Benign-appearing, vital signs normal, nausea well controlled, will give some IV fluids and another dose of Zofran, 1 hour of observation, by mouth trial, anticipate discharge if passes.  Patient is improved, vital signs remain normal, stable for discharge  Meds given in ED:  Medications  sodium chloride 0.9 % bolus 1,000 mL (0 mLs Intravenous Stopped 03/06/15 1719)  ondansetron (ZOFRAN) injection 4 mg (4 mg Intravenous Given 03/06/15 1641)    New Prescriptions   ONDANSETRON (ZOFRAN ODT) 4 MG DISINTEGRATING TABLET    Take 1 tablet (4 mg total) by mouth every 8 (eight) hours as needed for nausea.      Noemi Chapel, MD 03/06/15 [phone]

## 2015-03-06 NOTE — Discharge Instructions (Signed)

## 2015-03-06 NOTE — ED Notes (Signed)
Bed: WB91 Expected date:  Expected time:  Means of arrival:  Comments: EMS/2F/n/v/d

## 2015-03-06 NOTE — ED Notes (Signed)
Per patient, states slight nausea, "something I need to sleep off", no vomiting or diarrhea

## 2015-03-18 DIAGNOSIS — Z79899 Other long term (current) drug therapy: Secondary | ICD-10-CM | POA: Diagnosis not present

## 2015-03-23 DIAGNOSIS — Z79899 Other long term (current) drug therapy: Secondary | ICD-10-CM | POA: Diagnosis not present

## 2015-04-06 DIAGNOSIS — Z79899 Other long term (current) drug therapy: Secondary | ICD-10-CM | POA: Diagnosis not present

## 2015-04-10 ENCOUNTER — Encounter: Payer: Self-pay | Admitting: Vascular Surgery

## 2015-04-13 DIAGNOSIS — Z79899 Other long term (current) drug therapy: Secondary | ICD-10-CM | POA: Diagnosis not present

## 2015-04-14 ENCOUNTER — Ambulatory Visit (INDEPENDENT_AMBULATORY_CARE_PROVIDER_SITE_OTHER): Payer: Medicare Other | Admitting: Vascular Surgery

## 2015-04-14 ENCOUNTER — Encounter: Payer: Self-pay | Admitting: Vascular Surgery

## 2015-04-14 VITALS — BP 127/74 | HR 57 | Ht 69.0 in | Wt 167.6 lb

## 2015-04-14 DIAGNOSIS — I83891 Varicose veins of right lower extremities with other complications: Secondary | ICD-10-CM | POA: Insufficient documentation

## 2015-04-14 NOTE — Progress Notes (Signed)
Subjective:     Patient ID: Francee Gentile, female   DOB: 13-Nov-1945, 69 y.o.   MRN: [id number]  HPI This 69 year old female returns for final follow-up. She has had laser ablation of the right great saphenous vein followed by multiple stab phlebectomy and sclerotherapy 3 months later. She is quite pleased with her result. She states she is having no pain or swelling in the right ankle and that the large cluster of painful varicosities in the pretibial region is now gone. She denies any chest pain dyspnea on exertion or hemoptysis   Review of Systems     Objective:   Physical Exam BP 127/74 mmHg  Pulse 57  Ht 5\' 9"  (1.753 m)  Wt 167 lb 9.6 oz (76.023 kg)  BMI 24.74 kg/m2  SpO2 100%   Gen. Well-developed well-nourished female in no apparent distress alert and oriented 3  lower extremity with no bulging varicosities noted. The pretibial reticular vein network is now gone. There is one prominent vein which is not dilated in the pretibial region and does not need treatment at this time.     Assessment:      excellent result after laser ablation right great saphenous vein followed the stab phlebectomy and sclerotherapy     Plan:      return to see [place] on when necessary basis

## 2015-04-16 DIAGNOSIS — H401231 Low-tension glaucoma, bilateral, mild stage: Secondary | ICD-10-CM | POA: Diagnosis not present

## 2015-04-20 DIAGNOSIS — Z79899 Other long term (current) drug therapy: Secondary | ICD-10-CM | POA: Diagnosis not present

## 2015-05-13 DIAGNOSIS — H401231 Low-tension glaucoma, bilateral, mild stage: Secondary | ICD-10-CM | POA: Diagnosis not present

## 2015-05-19 DIAGNOSIS — L259 Unspecified contact dermatitis, unspecified cause: Secondary | ICD-10-CM | POA: Diagnosis not present

## 2015-05-19 DIAGNOSIS — H01003 Unspecified blepharitis right eye, unspecified eyelid: Secondary | ICD-10-CM | POA: Diagnosis not present

## 2015-07-21 ENCOUNTER — Ambulatory Visit (INDEPENDENT_AMBULATORY_CARE_PROVIDER_SITE_OTHER): Payer: Medicare Other | Admitting: Psychology

## 2015-07-21 DIAGNOSIS — F331 Major depressive disorder, recurrent, moderate: Secondary | ICD-10-CM | POA: Diagnosis not present

## 2015-07-21 DIAGNOSIS — F1021 Alcohol dependence, in remission: Secondary | ICD-10-CM | POA: Diagnosis not present

## 2015-07-30 ENCOUNTER — Ambulatory Visit (INDEPENDENT_AMBULATORY_CARE_PROVIDER_SITE_OTHER): Payer: Medicare Other | Admitting: Psychology

## 2015-07-30 DIAGNOSIS — F332 Major depressive disorder, recurrent severe without psychotic features: Secondary | ICD-10-CM | POA: Diagnosis not present

## 2015-08-11 ENCOUNTER — Ambulatory Visit: Payer: Medicare Other | Admitting: Psychology

## 2015-08-17 ENCOUNTER — Ambulatory Visit (INDEPENDENT_AMBULATORY_CARE_PROVIDER_SITE_OTHER): Payer: Medicare Other | Admitting: Psychology

## 2015-08-17 DIAGNOSIS — F332 Major depressive disorder, recurrent severe without psychotic features: Secondary | ICD-10-CM

## 2015-09-01 ENCOUNTER — Ambulatory Visit (INDEPENDENT_AMBULATORY_CARE_PROVIDER_SITE_OTHER): Payer: Medicare Other | Admitting: Psychology

## 2015-09-01 DIAGNOSIS — F332 Major depressive disorder, recurrent severe without psychotic features: Secondary | ICD-10-CM | POA: Diagnosis not present

## 2015-09-08 ENCOUNTER — Ambulatory Visit: Payer: Medicare Other | Admitting: Psychology

## 2015-09-09 ENCOUNTER — Ambulatory Visit (INDEPENDENT_AMBULATORY_CARE_PROVIDER_SITE_OTHER): Payer: Medicare Other | Admitting: Psychology

## 2015-09-09 DIAGNOSIS — F332 Major depressive disorder, recurrent severe without psychotic features: Secondary | ICD-10-CM

## 2015-09-22 ENCOUNTER — Ambulatory Visit (INDEPENDENT_AMBULATORY_CARE_PROVIDER_SITE_OTHER): Payer: Medicare Other | Admitting: Psychology

## 2015-09-22 DIAGNOSIS — F332 Major depressive disorder, recurrent severe without psychotic features: Secondary | ICD-10-CM | POA: Diagnosis not present

## 2015-10-01 DIAGNOSIS — L259 Unspecified contact dermatitis, unspecified cause: Secondary | ICD-10-CM | POA: Diagnosis not present

## 2015-10-01 DIAGNOSIS — H401230 Low-tension glaucoma, bilateral, stage unspecified: Secondary | ICD-10-CM | POA: Diagnosis not present

## 2015-10-09 ENCOUNTER — Ambulatory Visit: Payer: Medicare Other | Admitting: Psychology

## 2015-12-30 DIAGNOSIS — H401211 Low-tension glaucoma, right eye, mild stage: Secondary | ICD-10-CM | POA: Diagnosis not present

## 2015-12-30 DIAGNOSIS — H40052 Ocular hypertension, left eye: Secondary | ICD-10-CM | POA: Diagnosis not present

## 2015-12-30 DIAGNOSIS — H01003 Unspecified blepharitis right eye, unspecified eyelid: Secondary | ICD-10-CM | POA: Diagnosis not present

## 2015-12-30 DIAGNOSIS — H401221 Low-tension glaucoma, left eye, mild stage: Secondary | ICD-10-CM | POA: Diagnosis not present

## 2016-01-14 DIAGNOSIS — H401221 Low-tension glaucoma, left eye, mild stage: Secondary | ICD-10-CM | POA: Diagnosis not present

## 2016-02-18 DIAGNOSIS — H2513 Age-related nuclear cataract, bilateral: Secondary | ICD-10-CM | POA: Diagnosis not present

## 2016-02-18 DIAGNOSIS — H401211 Low-tension glaucoma, right eye, mild stage: Secondary | ICD-10-CM | POA: Diagnosis not present

## 2016-02-18 DIAGNOSIS — H401221 Low-tension glaucoma, left eye, mild stage: Secondary | ICD-10-CM | POA: Diagnosis not present

## 2016-02-18 DIAGNOSIS — H25013 Cortical age-related cataract, bilateral: Secondary | ICD-10-CM | POA: Diagnosis not present

## 2016-03-08 DIAGNOSIS — Z0001 Encounter for general adult medical examination with abnormal findings: Secondary | ICD-10-CM | POA: Diagnosis not present

## 2016-03-08 DIAGNOSIS — E785 Hyperlipidemia, unspecified: Secondary | ICD-10-CM | POA: Diagnosis not present

## 2016-03-08 DIAGNOSIS — E039 Hypothyroidism, unspecified: Secondary | ICD-10-CM | POA: Diagnosis not present

## 2016-03-17 DIAGNOSIS — D259 Leiomyoma of uterus, unspecified: Secondary | ICD-10-CM | POA: Diagnosis not present

## 2016-03-17 DIAGNOSIS — K4090 Unilateral inguinal hernia, without obstruction or gangrene, not specified as recurrent: Secondary | ICD-10-CM | POA: Diagnosis not present

## 2016-03-17 DIAGNOSIS — M8580 Other specified disorders of bone density and structure, unspecified site: Secondary | ICD-10-CM | POA: Diagnosis not present

## 2016-03-17 DIAGNOSIS — Z6822 Body mass index (BMI) 22.0-22.9, adult: Secondary | ICD-10-CM | POA: Diagnosis not present

## 2016-03-17 DIAGNOSIS — Z01411 Encounter for gynecological examination (general) (routine) with abnormal findings: Secondary | ICD-10-CM | POA: Diagnosis not present

## 2016-03-17 DIAGNOSIS — K6289 Other specified diseases of anus and rectum: Secondary | ICD-10-CM | POA: Diagnosis not present

## 2016-03-28 ENCOUNTER — Ambulatory Visit: Payer: Self-pay | Admitting: Surgery

## 2016-03-28 DIAGNOSIS — K4090 Unilateral inguinal hernia, without obstruction or gangrene, not specified as recurrent: Secondary | ICD-10-CM | POA: Diagnosis not present

## 2016-03-28 DIAGNOSIS — Z01818 Encounter for other preprocedural examination: Secondary | ICD-10-CM | POA: Diagnosis not present

## 2016-03-28 NOTE — H&P (Signed)
Kristina Anthony 03/28/2016 3:17 PM Location: [facility] Patient #: [id number] DOB: 08-11-1945 Single / Language: Kristina Anthony / Race: White Female  Patient Care Team: Kristina Limbo, MD as PCP - General (Family Medicine) Kristina Boston, MD as Consulting Physician (General Surgery) Kristina Manges, MD as Consulting Physician (Obstetrics and Gynecology)   History of Present Illness Kristina Hector MD; 03/28/2016 4:07 PM) The patient is a 70 year old female who presents with an inguinal hernia. Note for "Inguinal hernia": Pleasant woman sent for surgical consultation by her gynecologist, Dr. Fuller Anthony. Concern for right inguinal hernia.  "CAL-la-han" Pleasant active female. Exercises regularly. Can walk several miles without difficulty. Noticed a bulge in her right groin about a year ago. Never really hurt but she does confess that it has gotten larger in size. Concerning. History of constipation improved with "bear in the woods" supplement/laxative = BMs 2-3x/day. She had some type of fat removal/liposuction on her upper abdomen but has not had any other abdominal surgeries. Does not smoke. No history of skin infections.   Other Problems Kristina Anthony, Kristina Anthony; 03/28/2016 3:22 PM) Arthritis Depression Gastroesophageal Reflux Disease Thyroid Disease  Past Surgical History Kristina Anthony, Kristina Anthony; 03/28/2016 3:22 PM) Colon Polyp Removal - Colonoscopy Tonsillectomy  Diagnostic Studies History Kristina Anthony, Kristina Anthony; 03/28/2016 3:22 PM) Colonoscopy 1-5 years ago Mammogram within last year  Allergies Kristina Anthony, Kristina Anthony; 03/28/2016 3:23 PM) No Known Drug Allergies 03/28/2016  Medication History (Kristina Anthony, Kristina Anthony; 03/28/2016 3:24 PM) Viibryd (40MG  Tablet, Oral) Active. ClonazePAM (1MG  Tablet, Oral) Active. LamoTRIgine (100MG  Tablet, Oral) Active. Nature-Throid (97.5MG  Tablet, Oral) Active. Medications Reconciled  Social History (Kristina Anthony; 03/28/2016 3:22  PM) Alcohol use Remotely quit alcohol use. Caffeine use Coffee, Tea. No drug use Tobacco use Former smoker.  Family History Kristina Anthony, Kristina Anthony; 03/28/2016 3:22 PM) Colon Cancer Mother. Colon Polyps Brother. Depression Brother. Ovarian Cancer Mother. Prostate Cancer Father. Thyroid problems Mother.  Pregnancy / Birth History Kristina Anthony, Kristina Anthony; 03/28/2016 3:22 PM) Age at menarche 35 years. Contraceptive History Oral contraceptives. Gravida 0 Para 0 Regular periods     Review of Systems (Kristina Anthony; 03/28/2016 3:22 PM) General Not Present- Appetite Loss, Chills, Fatigue, Fever, Night Sweats, Weight Gain and Weight Loss. Skin Not Present- Change in Wart/Mole, Dryness, Hives, Jaundice, New Lesions, Non-Healing Wounds, Rash and Ulcer. HEENT Present- Ringing in the Ears and Wears glasses/contact lenses. Not Present- Earache, Hearing Loss, Hoarseness, Nose Bleed, Oral Ulcers, Seasonal Allergies, Sinus Pain, Sore Throat, Visual Disturbances and Yellow Eyes. Breast Not Present- Breast Mass, Breast Pain, Nipple Discharge and Skin Changes. Cardiovascular Not Present- Chest Pain, Difficulty Breathing Lying Down, Leg Cramps, Palpitations, Rapid Heart Rate, Shortness of Breath and Swelling of Extremities. Gastrointestinal Present- Rectal Pain. Not Present- Abdominal Pain, Bloating, Bloody Stool, Change in Bowel Habits, Chronic diarrhea, Constipation, Difficulty Swallowing, Excessive gas, Gets full quickly at meals, Hemorrhoids, Indigestion, Nausea and Vomiting. Female Genitourinary Not Present- Frequency, Nocturia, Painful Urination, Pelvic Pain and Urgency. Musculoskeletal Not Present- Back Pain, Joint Pain, Joint Stiffness, Muscle Pain, Muscle Weakness and Swelling of Extremities. Neurological Not Present- Decreased Memory, Fainting, Headaches, Numbness, Seizures, Tingling, Tremor, Trouble walking and Weakness. Psychiatric Present- Depression. Not Present- Anxiety, Bipolar,  Change in Sleep Pattern, Fearful and Frequent crying. Endocrine Not Present- Cold Intolerance, Excessive Hunger, Hair Changes, Heat Intolerance, Hot flashes and New Diabetes. Hematology Not Present- Blood Thinners, Easy Bruising, Excessive bleeding, Gland problems, HIV and Persistent Infections.  Vitals (Kristina Anthony Kristina Anthony; 03/28/2016 3:23 PM) 03/28/2016 3:23 PM Weight: 149  lb Height: 68in Body Surface Area: 1.8 m Body Mass Index: 22.66 kg/m  Temp.: 98.79F(Temporal)  Pulse: 81 (Regular)  BP: 118/74 (Sitting, Left Arm, Standard)      Physical Exam Kristina Hector MD; 03/28/2016 4:05 PM)  General Mental Status-Alert. General Appearance-Not in acute distress, Not Sickly. Orientation-Oriented X3. Hydration-Well hydrated. Voice-Normal. Note: Pleasant, smiling  Integumentary Global Assessment Upon inspection and palpation of skin surfaces of the - Axillae: non-tender, no inflammation or ulceration, no drainage. and Distribution of scalp and body hair is normal. General Characteristics Temperature - normal warmth is noted.  Head and Neck Head-normocephalic, atraumatic with no lesions or palpable masses. Face Global Assessment - atraumatic, no absence of expression. Neck Global Assessment - no abnormal movements, no bruit auscultated on the right, no bruit auscultated on the left, no decreased range of motion, non-tender. Trachea-midline. Thyroid Gland Characteristics - non-tender.  Eye Eyeball - Left-Extraocular movements intact, No Nystagmus. Eyeball - Right-Extraocular movements intact, No Nystagmus. Cornea - Left-No Hazy. Cornea - Right-No Hazy. Sclera/Conjunctiva - Left-No scleral icterus, No Discharge. Sclera/Conjunctiva - Right-No scleral icterus, No Discharge. Pupil - Left-Direct reaction to light normal. Pupil - Right-Direct reaction to light normal.  ENMT Ears Pinna - Left - no drainage observed, no generalized tenderness  observed. Right - no drainage observed, no generalized tenderness observed. Nose and Sinuses External Inspection of the Nose - no destructive lesion observed. Inspection of the nares - Left - quiet respiration. Right - quiet respiration. Mouth and Throat Lips - Upper Lip - no fissures observed, no pallor noted. Lower Lip - no fissures observed, no pallor noted. Nasopharynx - no discharge present. Oral Cavity/Oropharynx - Tongue - no dryness observed. Oral Mucosa - no cyanosis observed. Hypopharynx - no evidence of airway distress observed.  Chest and Lung Exam Inspection Movements - Normal and Symmetrical. Accessory muscles - No use of accessory muscles in breathing. Palpation Palpation of the chest reveals - Non-tender. Auscultation Breath sounds - Normal and Clear.  Cardiovascular Auscultation Rhythm - Regular. Murmurs & Other Heart Sounds - Auscultation of the heart reveals - No Murmurs and No Systolic Clicks.  Abdomen Inspection Inspection of the abdomen reveals - No Visible peristalsis and No Abnormal pulsations. Umbilicus - No Bleeding, No Urine drainage. Palpation/Percussion Palpation and Percussion of the abdomen reveal - Soft, Non Tender, No Rebound tenderness, No Rigidity (guarding) and No Cutaneous hyperesthesia. Note: Abdomen soft. Nontender, nondistended. No guarding. No umbilical no other hernias. Flat transverse upper abdominal incision consistent with prior liposuction. No diastases recti  Female Genitourinary Sexual Maturity Tanner 5 - Adult hair pattern. Note: Obvious right groin bulging reducible consistent with inguinal hernia. No left inguinal hernia. Live adenopathy. No femoral canal discomfort or obvious hernia. No vaginal bleeding nor discharge  Peripheral Vascular Upper Extremity Inspection - Left - No Cyanotic nailbeds, Not Ischemic. Right - No Cyanotic nailbeds, Not Ischemic.  Neurologic Neurologic evaluation reveals -normal attention span and ability  to concentrate, able to name objects and repeat phrases. Appropriate fund of knowledge , normal sensation and normal coordination. Mental Status Affect - not angry, not paranoid. Cranial Nerves-Normal Bilaterally. Gait-Normal.  Neuropsychiatric Mental status exam performed with findings of-able to articulate well with normal speech/language, rate, volume and coherence, thought content normal with ability to perform basic computations and apply abstract reasoning and no evidence of hallucinations, delusions, obsessions or homicidal/suicidal ideation.  Musculoskeletal Global Assessment Spine, Ribs and Pelvis - no instability, subluxation or laxity. Right Upper Extremity - no instability, subluxation or laxity.  Lymphatic Head & Neck  General Head & Neck Lymphatics: Bilateral - Description - No Localized lymphadenopathy. Axillary  General Axillary Region: Bilateral - Description - No Localized lymphadenopathy. Femoral & Inguinal  Generalized Femoral & Inguinal Lymphatics: Left - Description - No Localized lymphadenopathy. Right - Description - No Localized lymphadenopathy.    Assessment & Plan Kristina Hector MD; 03/28/2016 4:02 PM)  RIGHT INGUINAL HERNIA (K40.90) Impression: I think she would benefit from laparoscopic exploration and repair of hernias found. Definitely has a right inguinal hernia. She is interested in proceeding.  PREOP - ING HERNIA - ENCOUNTER FOR PREOPERATIVE EXAMINATION FOR GENERAL SURGICAL PROCEDURE (Z01.818)  Current Plans You are being scheduled for surgery - Our schedulers will call you.  You should hear from our office's scheduling department within 5 working days about the location, date, and time of surgery. We try to make accommodations for patient's preferences in scheduling surgery, but sometimes the OR schedule or the surgeon's schedule prevents [place] from making those accommodations.  If you have not heard from our office [phone]) in 5  working days, call the office and ask for your surgeon's nurse.  If you have other questions about your diagnosis, plan, or surgery, call the office and ask for your surgeon's nurse.  Written instructions provided The anatomy & physiology of the abdominal wall and pelvic floor was discussed. The pathophysiology of hernias in the inguinal and pelvic region was discussed. Natural history risks such as progressive enlargement, pain, incarceration, and strangulation was discussed. Contributors to complications such as smoking, obesity, diabetes, prior surgery, etc were discussed.  I feel the risks of no intervention will lead to serious problems that outweigh the operative risks; therefore, I recommended surgery to reduce and repair the hernia. I explained laparoscopic techniques with possible need for an open approach. I noted usual use of mesh to patch and/or buttress hernia repair  Risks such as bleeding, infection, abscess, need for further treatment, heart attack, death, and other risks were discussed. I noted a good likelihood this will help address the problem. Goals of post-operative recovery were discussed as well. Possibility that this will not correct all symptoms was explained. I stressed the importance of low-impact activity, aggressive pain control, avoiding constipation, & not pushing through pain to minimize risk of post-operative chronic pain or injury. Possibility of reherniation was discussed. We will work to minimize complications.  An educational handout further explaining the pathology & treatment options was given as well. Questions were answered. The patient expresses understanding & wishes to proceed with surgery.  Pt Education - Pamphlet Given - Laparoscopic Hernia Repair: discussed with patient and provided information. Pt Education - [facility] Pain Control (Norman Piacentini) Pt Education - [facility] Hernia Post-Op HCI (Eda Magnussen): discussed with patient and provided informat  Kristina Anthony, M.D.,  F.A.C.S. Gastrointestinal and Minimally Invasive Surgery [facility] [address], [place], [place] [address] [phone] Main / Paging

## 2016-04-08 DIAGNOSIS — K4120 Bilateral femoral hernia, without obstruction or gangrene, not specified as recurrent: Secondary | ICD-10-CM | POA: Diagnosis not present

## 2016-04-08 DIAGNOSIS — K4190 Unilateral femoral hernia, without obstruction or gangrene, not specified as recurrent: Secondary | ICD-10-CM | POA: Diagnosis not present

## 2016-04-08 DIAGNOSIS — K4090 Unilateral inguinal hernia, without obstruction or gangrene, not specified as recurrent: Secondary | ICD-10-CM | POA: Diagnosis not present

## 2016-04-08 DIAGNOSIS — K4130 Unilateral femoral hernia, with obstruction, without gangrene, not specified as recurrent: Secondary | ICD-10-CM | POA: Diagnosis not present

## 2016-05-19 DIAGNOSIS — E039 Hypothyroidism, unspecified: Secondary | ICD-10-CM | POA: Diagnosis not present

## 2016-07-29 ENCOUNTER — Encounter (HOSPITAL_COMMUNITY): Payer: Self-pay

## 2016-07-29 ENCOUNTER — Inpatient Hospital Stay (HOSPITAL_COMMUNITY)
Admission: EM | Admit: 2016-07-29 | Discharge: 2016-08-02 | DRG: 072 | Disposition: A | Discharge status: Home / Self Care | Payer: Medicare Other | Attending: Internal Medicine | Admitting: Internal Medicine

## 2016-07-29 ENCOUNTER — Emergency Department (HOSPITAL_COMMUNITY): Payer: Medicare Other

## 2016-07-29 DIAGNOSIS — Z8249 Family history of ischemic heart disease and other diseases of the circulatory system: Secondary | ICD-10-CM | POA: Diagnosis not present

## 2016-07-29 DIAGNOSIS — R918 Other nonspecific abnormal finding of lung field: Secondary | ICD-10-CM | POA: Diagnosis not present

## 2016-07-29 DIAGNOSIS — Z01818 Encounter for other preprocedural examination: Secondary | ICD-10-CM

## 2016-07-29 DIAGNOSIS — R42 Dizziness and giddiness: Secondary | ICD-10-CM | POA: Diagnosis not present

## 2016-07-29 DIAGNOSIS — K219 Gastro-esophageal reflux disease without esophagitis: Secondary | ICD-10-CM | POA: Diagnosis present

## 2016-07-29 DIAGNOSIS — F319 Bipolar disorder, unspecified: Secondary | ICD-10-CM | POA: Diagnosis present

## 2016-07-29 DIAGNOSIS — E039 Hypothyroidism, unspecified: Secondary | ICD-10-CM | POA: Diagnosis present

## 2016-07-29 DIAGNOSIS — D496 Neoplasm of unspecified behavior of brain: Secondary | ICD-10-CM

## 2016-07-29 DIAGNOSIS — Z79899 Other long term (current) drug therapy: Secondary | ICD-10-CM

## 2016-07-29 DIAGNOSIS — R2681 Unsteadiness on feet: Secondary | ICD-10-CM | POA: Diagnosis present

## 2016-07-29 DIAGNOSIS — Z8371 Family history of colonic polyps: Secondary | ICD-10-CM

## 2016-07-29 DIAGNOSIS — R935 Abnormal findings on diagnostic imaging of other abdominal regions, including retroperitoneum: Secondary | ICD-10-CM | POA: Diagnosis not present

## 2016-07-29 DIAGNOSIS — R51 Headache: Secondary | ICD-10-CM | POA: Diagnosis not present

## 2016-07-29 DIAGNOSIS — E785 Hyperlipidemia, unspecified: Secondary | ICD-10-CM | POA: Diagnosis present

## 2016-07-29 DIAGNOSIS — I10 Essential (primary) hypertension: Secondary | ICD-10-CM | POA: Diagnosis present

## 2016-07-29 DIAGNOSIS — G9389 Other specified disorders of brain: Secondary | ICD-10-CM | POA: Diagnosis present

## 2016-07-29 DIAGNOSIS — R413 Other amnesia: Secondary | ICD-10-CM | POA: Diagnosis present

## 2016-07-29 DIAGNOSIS — R2689 Other abnormalities of gait and mobility: Secondary | ICD-10-CM

## 2016-07-29 DIAGNOSIS — R531 Weakness: Secondary | ICD-10-CM | POA: Diagnosis not present

## 2016-07-29 DIAGNOSIS — Z87891 Personal history of nicotine dependence: Secondary | ICD-10-CM

## 2016-07-29 DIAGNOSIS — Z86010 Personal history of colonic polyps: Secondary | ICD-10-CM

## 2016-07-29 DIAGNOSIS — Z800 Family history of malignant neoplasm of digestive organs: Secondary | ICD-10-CM | POA: Diagnosis not present

## 2016-07-29 DIAGNOSIS — R930 Abnormal findings on diagnostic imaging of skull and head, not elsewhere classified: Secondary | ICD-10-CM | POA: Diagnosis not present

## 2016-07-29 DIAGNOSIS — G939 Disorder of brain, unspecified: Principal | ICD-10-CM

## 2016-07-29 DIAGNOSIS — Z09 Encounter for follow-up examination after completed treatment for conditions other than malignant neoplasm: Secondary | ICD-10-CM

## 2016-07-29 LAB — BASIC METABOLIC PANEL
ANION GAP: 9 (ref 5–15)
BUN: 8 mg/dL (ref 6–20)
CALCIUM: 9.3 mg/dL (ref 8.9–10.3)
CHLORIDE: 107 mmol/L (ref 101–111)
CO2: 21 mmol/L — AB (ref 22–32)
Creatinine, Ser: 0.73 mg/dL (ref 0.44–1.00)
GFR calc non Af Amer: >60 mL/min (ref >60)
Glucose, Bld: 88 mg/dL (ref 65–99)
Potassium: 3.6 mmol/L (ref 3.5–5.1)
Sodium: 137 mmol/L (ref 135–145)

## 2016-07-29 LAB — CBC
HCT: 37.6 % (ref 36.0–46.0)
HEMOGLOBIN: 13 g/dL (ref 12.0–15.0)
MCH: 32.3 pg (ref 26.0–34.0)
MCHC: 34.6 g/dL (ref 30.0–36.0)
MCV: 93.3 fL (ref 78.0–100.0)
Platelets: 268 10*3/uL (ref 150–400)
RBC: 4.03 MIL/uL (ref 3.87–5.11)
RDW: 12.6 % (ref 11.5–15.5)
WBC: 7.9 10*3/uL (ref 4.0–10.5)

## 2016-07-29 MED ORDER — DEXAMETHASONE SODIUM PHOSPHATE 10 MG/ML IJ SOLN
10.0000 mg | Freq: Once | INTRAMUSCULAR | Status: AC
  Administered 2016-07-29: 10 mg via INTRAVENOUS
  Filled 2016-07-29: qty 1

## 2016-07-29 NOTE — ED Triage Notes (Signed)
PT STS ON JAN 12TH, SHE HAD A SUDDEN ONSET OF DIZZINESS AND A HEADACHE. SHE STS A COUPLE OF DAYS LATER, HER LEFT HAND STARTED TO FELL NUMB. SHE STS THE DIZZINESS IS STILL THERE AND TODAY WHILE WORKING OUT, HER TRAINER NOTICED THAT THE LEFT SIDE OF HER BODY "WAS NOT RIGHT". PT CONTACTED HER PCP AND WAS TOLD TO COME HERE. SHE ALSO STS MILD MEMORY LOSS, AND UNSTEADY GAIT DAYS AGO, AS WELL.

## 2016-07-29 NOTE — ED Notes (Signed)
Attempted to call report.  Secretary says nurse will call back in a few minutes.

## 2016-07-29 NOTE — ED Provider Notes (Signed)
[facility] DEPT Provider Note   CSN: [id number] Arrival date & time: 07/29/16  1826     History   Chief Complaint Chief Complaint  Patient presents with  . Dizziness  . Numbness    HPI MACKINZEE ADERHOLT is a 71 y.o. female. CC: weakness, headache, balance issues.  HPI:  This is a 71 year old female previously healthy. She describes onset of symptoms on January 12 waking with a sudden severe right-sided headache. States she had nausea and felt "sick" that day. She has not had headaches since that time. She has noticed subtle progressive symptoms. States she has difficulty with balance or coordination. She strictly dropping things. Her left hand is now numb. 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 Her friend is noticed progressive symptoms as well including difficulty with balance, coordination, and difficulty with strength when doing a workout yesterday and today with her left upper extremity.  No history of malignancies.  A smoker. Quit over 10 years ago. History of alcohol abuse in the past. It is over for more than 1 yr. No history of abnormal mammogram, pap smear.  Had adenomatous polyp removed.2008  Past Medical History:  Diagnosis Date  . Adenomatous colon polyp 03/2007  . Alcohol abuse   . Allergy   . Anxiety   . Arthritis   . 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 Chronic constipation   . Depression   . Diverticulosis   . Esophageal stricture   . GERD (gastroesophageal reflux disease)   . Hemorrhoids   . Hyperlipidemia   . Hypertension   . Hypothyroidism   . Substance abuse     Patient Active Problem List   Diagnosis Date Noted  . 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 Varicose veins of leg with complications [id number]  . Varicose veins of lower extremities with complications [id number]  . Family history of ovarian cancer 08/06/2012  . ESOPHAGEAL STRICTURE 10/26/2009  . GERD 01/26/2009  . CHEST PAIN  01/26/2009  . HYPOTHYROIDISM 11/26/2008  . DEPRESSION 11/26/2008  . CONSTIPATION 11/26/2008  . PERSONAL HX COLONIC POLYPS 11/26/2008    Past Surgical History:  Procedure Laterality Date  . COLONOSCOPY    . EYELID REPAIR W/ SKIN GRAFT    . TONSILLECTOMY      OB History    Gravida Para Term Preterm AB Living   2 0           SAB TAB Ectopic Multiple Live Births                   Home Medications    Prior to Admission medications   Medication Sig Start Date End Date Taking? Authorizing Provider  clonazePAM (KLONOPIN) 1 MG tablet Take 0.5 mg by mouth at bedtime. 07/19/16  Yes Historical Provider, MD  Cyanocobalamin 200 MCG/SPRAY LIQD Take 1,000 mcg by mouth daily.   Yes Historical Provider, MD  hydrOXYzine (VISTARIL) 25 MG capsule Take 25 mg by mouth every 6 (six) hours as needed for itching.  07/25/16  Yes Historical Provider, MD  lamoTRIgine (LAMICTAL) 100 MG tablet Take 100 mg by mouth daily. 07/16/16  Yes Historical Provider, MD  Multiple Vitamin (MULTIVITAMIN) tablet Take 1 tablet by mouth daily.     Yes Historical Provider, MD  NATURE-THROID 65 MG tablet Take 65 mg by mouth daily. 05/20/16  Yes Historical Provider, MD  nortriptyline (PAMELOR) 10 MG capsule Take 20 mg by mouth at bedtime. 07/22/16  Yes  Historical Provider, MD  VIIBRYD 40 MG TABS Take 40 mg by mouth daily. 06/21/16  Yes Historical Provider, MD  Vitamin D-Vitamin K (VITAMIN K2-VITAMIN D3 PO) Take 2 sprays by mouth daily.   Yes Historical Provider, MD    Family History Family History  Problem Relation Age of Onset  . Colon cancer Mother     at age 77  . Colon polyps Mother   . Prostate cancer Father   . Colon polyps Father   . Colon polyps Brother   . Heart disease Maternal Grandfather     Social History Social History  Substance Use Topics  . Smoking status: Former Smoker    Types: Cigarettes  . Smokeless tobacco: Never Used  . Alcohol use 0.0 oz/week     Comment: 3-4 glasses per week-pt states she quit  2013     07/09/2012/sd     Allergies   Patient has no known allergies.   Review of Systems Review of Systems  Constitutional: Negative for appetite change, chills, diaphoresis, fatigue and fever.  HENT: Negative for mouth sores, sore throat and trouble swallowing.   Eyes: Negative for visual disturbance.  Respiratory: Negative for cough, chest tightness, shortness of breath and wheezing.   Cardiovascular: Negative for chest pain.  Gastrointestinal: Negative for abdominal distention, abdominal pain, diarrhea, nausea and vomiting.  Endocrine: Negative for polydipsia, polyphagia and polyuria.  Genitourinary: Negative for dysuria, frequency and hematuria.  Musculoskeletal: Negative for gait problem.  Skin: Negative for color change, pallor and rash.  Neurological: Positive for dizziness, weakness, light-headedness, numbness and headaches. Negative for syncope.       Difficulty with balance  Hematological: Does not bruise/bleed easily.  Psychiatric/Behavioral: Negative for behavioral problems and confusion.     Physical Exam Updated Vital Signs BP 137/89 (BP Location: Left Arm)   Pulse 70   Temp 97.8 F (36.6 C) (Oral)   Resp 21   Ht 5\' 8"  (1.727 m)   Wt 150 lb (68 kg)   SpO2 100%   BMI 22.81 kg/m   Physical Exam  Constitutional: She is oriented to person, place, and time. She appears well-developed and well-nourished. No distress.  HENT:  Head: Normocephalic.  Eyes: Conjunctivae are normal. Pupils are equal, round, and reactive to light. No scleral icterus.  Neck: Normal range of motion. Neck supple. No thyromegaly present.  Cardiovascular: Normal rate and regular rhythm.  Exam reveals no gallop and no friction rub.   No murmur heard. Pulmonary/Chest: Effort normal and breath sounds normal. No respiratory distress. She has no wheezes. She has no rales.  Abdominal: Soft. Bowel sounds are normal. She exhibits no distension. There is no tenderness. There is no rebound.    Musculoskeletal: Normal range of motion.  Neurological: She is alert and oriented to person, place, and time.  Cranial nerves intact symmetric. Normal vision.  decreased sensation in her left hand. No pronator drift. No leg drift. She has difficulty with balance. She has dysmetria with  left upper extremity. She has difficulty with heel to shin of her left lower extremity.  Skin: Skin is warm and dry. No rash noted.  Psychiatric: She has a normal mood and affect. Her behavior is normal.     ED Treatments / Results  Labs (all labs ordered are listed, but only abnormal results are displayed) Labs Reviewed  BASIC METABOLIC PANEL - Abnormal; Notable for the following:       Result Value   CO2 21 (*)    All other  components within normal limits  CBC  URINALYSIS, ROUTINE W REFLEX MICROSCOPIC    EKG  EKG Interpretation None       Radiology Ct Head Wo Contrast  Result Date: 07/29/2016 CLINICAL DATA:  Sudden onset of dizziness and headache with left arm numbness EXAM: CT HEAD WITHOUT CONTRAST TECHNIQUE: Contiguous axial images were obtained from the base of the skull through the vertex without intravenous contrast. COMPARISON:  None. FINDINGS: Brain: In the right parietal lobe there is a 4.8 x 3.9 cm predominately cystic lesion with evidence of a a more high density mural nodule identified which measures 2.4 cm in greatest dimension. Significant surrounding white matter edema is noted. Midline shift is seen of approximately 4 mm from right to left. Vascular: No hyperdense vessel or unexpected calcification. Skull: Normal. Negative for fracture or focal lesion. Sinuses/Orbits: No acute finding. Other: None. 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 Further evaluation by means of MRI is recommended Electronically Signed   By: Inez Catalina M.D.   On: 07/29/2016 19:54    Procedures Procedures (including critical care  time)  Medications Ordered in ED Medications  dexamethasone (DECADRON) injection 10 mg (not administered)     Initial Impression / Assessment and Plan / ED Course  I have reviewed the triage vital signs and the nursing notes.  Pertinent labs & imaging results that were available during my care of the patient were reviewed by me and considered in my medical decision making (see chart for details).  Clinical Course as of Jul 29 2232  Fri Jul 29, 2016  2119 CT Head Wo Contrast [MJ]  2119 CT Head Wo Contrast [MJ]    Clinical Course User Index [MJ] Tanna Furry, MD    CT of her head noncontrast shows cystic lesion right parietal pain with surrounding vasogenic edema and midline shift to 4 mm.  I discussed this with her at length. Astra IV Decadron and neurological consultation. We'll recommend admission for neurology and possible neurosurgical consultation.  Final Clinical Impressions(s) / ED Diagnoses   Final diagnoses:  Brain mass    New Prescriptions New Prescriptions   No medications on file     Tanna Furry, MD 07/29/16 2234

## 2016-07-29 NOTE — H&P (Signed)
Kristina Anthony [id number] DOB: 04/23/1946 DOA: 07/29/2016     PCP: Anthoney Harada, MD   Outpatient Specialists: none  Patient coming from:  home Lives alone,       Chief Complaint: Headache  HPI: Kristina Anthony is a 71 y.o. female with medical history significant of GERD and remote alcohol abuse in remision, HTN, hypothyroidism, HLD    Presented with  left sided weakness.  Reports on January 12 it was sudden in onset severe headache associated with nausea vertigo.  she also have had some associated left hand numbness and feeling dizzy vertigo like. She thought it was her medication and tried to wean herself off klonapin. She has been dropping things from her left hand. While at the grocery she was running into walls.  She could not  Even tie her shoes.  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 Patient reports occasional memory loss and unsteady gait. She contacted her PCP office who recommended to go to ER.  Regarding pertinent Chronic problems: Patient had remote history of alcohol abuse but reports had not been drinking for the past 1 year   IN ER:  Temp (24hrs), Avg:97.6 F (36.4 C), Min:97.4 F (36.3 C), Max:97.8 F (36.6 C)  Hr 70 BP 137/89 Na 137 K 3.6 cr 0.73 Bicarb 21   WBC 7.9 Hg 13 CT shows large cystic mass lesion with multiple nodules in the right parietal frontal region surrounding white minor edema. 4 mm right-to-left midline shift.  Following Medications were ordered in ER: Medications  dexamethasone (DECADRON) injection 10 mg (10 mg Intravenous Given 07/29/16 2244)     ER provider discussed case with:  Neurology who recommends admitted to [facility] obtain MRI started on IV steroids, consult neurosurgery in a.m.  Hospitalist was called for admission for New diagnosis of right-sided brain mass with midline shift  Review of Systems:    Pertinent positives include:   localizing neurological complaints, headache, memory loss.  gait abnormality  Constitutional:  No weight loss, night sweats, Fevers, chills, fatigue, weight loss  HEENT:  No headaches, Difficulty swallowing,Tooth/dental problems,Sore throat,  No sneezing, itching, ear ache, nasal congestion, post nasal drip,  Cardio-vascular:  No chest pain, Orthopnea, PND, anasarca, dizziness, palpitations.no Bilateral lower extremity swelling  GI:  No heartburn, indigestion, abdominal pain, nausea, vomiting, diarrhea, change in bowel habits, loss of appetite, melena, blood in stool, hematemesis Resp:  no shortness of breath at rest. No dyspnea on exertion, No excess mucus, no productive cough, No non-productive cough, No coughing up of blood.No change in color of mucus.No wheezing. Skin:  no rash or lesions. No jaundice GU:  no dysuria, change in color of urine, no urgency or frequency. No straining to urinate.  No flank pain.  Musculoskeletal:  No joint pain or no joint swelling. No decreased range of motion. No back pain.  Psych:  No change in mood or affect. No depression or anxiety. No  Neuro: no no tingling, no weakness, no double vision,  no slurred speech, no confusion  As per HPI otherwise 10 point review of systems negative.   Past Medical History: Past Medical History:  Diagnosis Date  . Adenomatous colon polyp 03/2007  . Alcohol abuse   . Allergy   . Anxiety   . Arthritis   . Cancer ([place])    SKIN-REMOVED  . Chronic constipation   . Depression   .  Diverticulosis   . Esophageal stricture   . GERD (gastroesophageal reflux disease)   . Hemorrhoids   . Hyperlipidemia   . Hypertension   . Hypothyroidism   . Substance abuse    Past Surgical History:  Procedure Laterality Date  . COLONOSCOPY    . EYELID REPAIR W/ SKIN GRAFT    . TONSILLECTOMY       Social History:  Ambulatory   independently      reports that she has quit smoking. Her smoking use included Cigarettes.  She has never used smokeless tobacco. She reports that she drinks alcohol. She reports that she does not use drugs.  Allergies:  No Known Allergies     Family History:   Family History  Problem Relation Age of Onset  . Colon cancer Mother     at age 79  . Colon polyps Mother   . Prostate cancer Father   . Colon polyps Father   . Colon polyps Brother   . Heart disease Maternal Grandfather     Medications: Prior to Admission medications   Medication Sig Start Date End Date Taking? Authorizing Provider  clonazePAM (KLONOPIN) 1 MG tablet Take 0.5 mg by mouth at bedtime. 07/19/16  Yes Historical Provider, MD  Cyanocobalamin 200 MCG/SPRAY LIQD Take 1,000 mcg by mouth daily.   Yes Historical Provider, MD  hydrOXYzine (VISTARIL) 25 MG capsule Take 25 mg by mouth every 6 (six) hours as needed for itching.  07/25/16  Yes Historical Provider, MD  lamoTRIgine (LAMICTAL) 100 MG tablet Take 100 mg by mouth daily. 07/16/16  Yes Historical Provider, MD  Multiple Vitamin (MULTIVITAMIN) tablet Take 1 tablet by mouth daily.     Yes Historical Provider, MD  NATURE-THROID 65 MG tablet Take 65 mg by mouth daily. 05/20/16  Yes Historical Provider, MD  nortriptyline (PAMELOR) 10 MG capsule Take 20 mg by mouth at bedtime. 07/22/16  Yes Historical Provider, MD  VIIBRYD 40 MG TABS Take 40 mg by mouth daily. 06/21/16  Yes Historical Provider, MD  Vitamin D-Vitamin K (VITAMIN K2-VITAMIN D3 PO) Take 2 sprays by mouth daily.   Yes Historical Provider, MD    Physical Exam: Patient Vitals for the past 24 hrs:  BP Temp Temp src Pulse Resp SpO2 Height Weight  07/29/16 2120 137/89 97.8 F (36.6 C) Oral 70 21 100 % - -  07/29/16 1845 - - - - - - 5\' 8"  (1.727 m) 68 kg (150 lb)  07/29/16 1837 142/92 97.4 F (36.3 C) Oral 98 16 96 % - -    1. General:  in No Acute distress 2. Psychological: Alert and  Oriented 3. Head/ENT:    Dry Mucous Membranes                          Head Non traumatic, neck supple                           Normal   Dentition 4. SKIN: normal  Skin turgor,  Skin clean Dry and intact no rash 5. Heart: Regular rate and rhythm no  Murmur, Rub or gallop 6. Lungs:  Clear to auscultation bilaterally, no wheezes or crackles   7. Abdomen: Soft, non-tender, Non distended 8. Lower extremities: no clubbing, cyanosis, or edema 9. Neurologically  strength Diminished in left  cranial nerves II through XII intact 10. MSK: Normal range of motion   body mass index is 22.81  kg/m.  Labs on Admission:   Labs on Admission: I have personally reviewed following labs and imaging studies  CBC:  Recent Labs Lab 07/29/16 1928  WBC 7.9  HGB 13.0  HCT 37.6  MCV 93.3  PLT [id number]   Basic Metabolic Panel:  Recent Labs Lab 07/29/16 1928  NA 137  K 3.6  CL 107  CO2 21*  GLUCOSE 88  BUN 8  CREATININE 0.73  CALCIUM 9.3   GFR: Estimated Creatinine Clearance: 67 mL/min (by C-G formula based on SCr of 0.73 mg/dL). Liver Function Tests: No results for input(s): AST, ALT, ALKPHOS, BILITOT, PROT, ALBUMIN in the last 168 hours. No results for input(s): LIPASE, AMYLASE in the last 168 hours. No results for input(s): AMMONIA in the last 168 hours. Coagulation Profile: No results for input(s): INR, PROTIME in the last 168 hours. Cardiac Enzymes: No results for input(s): CKTOTAL, CKMB, CKMBINDEX, TROPONINI in the last 168 hours. BNP (last 3 results) No results for input(s): PROBNP in the last 8760 hours. HbA1C: No results for input(s): HGBA1C in the last 72 hours. CBG: No results for input(s): GLUCAP in the last 168 hours. Lipid Profile: No results for input(s): CHOL, HDL, LDLCALC, TRIG, CHOLHDL, LDLDIRECT in the last 72 hours. Thyroid Function Tests: No results for input(s): TSH, T4TOTAL, FREET4, T3FREE, THYROIDAB in the last 72 hours. Anemia Panel: No results for input(s): VITAMINB12, FOLATE, FERRITIN, TIBC, IRON, RETICCTPCT in the last 72 hours. Urine analysis: No results found for:  COLORURINE, APPEARANCEUR, LABSPEC, PHURINE, GLUCOSEU, HGBUR, BILIRUBINUR, KETONESUR, PROTEINUR, UROBILINOGEN, NITRITE, LEUKOCYTESUR Sepsis Labs: @LABRCNTIP (procalcitonin:4,lacticidven:4) )No results found for this or any previous visit (from the past 240 hour(s)).    UA not ordered  No results found for: HGBA1C  Estimated Creatinine Clearance: 67 mL/min (by C-G formula based on SCr of 0.73 mg/dL).  BNP (last 3 results) No results for input(s): PROBNP in the last 8760 hours.   ECG REPORT Not ordered  Filed Weights   07/29/16 1845  Weight: 68 kg (150 lb)     Cultures: No results found for: SDES, SPECREQUEST, CULT, REPTSTATUS   Radiological Exams on Admission: Ct Head Wo Contrast  Result Date: 07/29/2016 CLINICAL DATA:  Sudden onset of dizziness and headache with left arm numbness EXAM: CT HEAD WITHOUT CONTRAST TECHNIQUE: Contiguous axial images were obtained from the base of the skull through the vertex without intravenous contrast. COMPARISON:  None. FINDINGS: Brain: In the right parietal lobe there is a 4.8 x 3.9 cm predominately cystic lesion with evidence of a a more high density mural nodule identified which measures 2.4 cm in greatest dimension. Significant surrounding white matter edema is noted. Midline shift is seen of approximately 4 mm from right to left. Vascular: No hyperdense vessel or unexpected calcification. Skull: Normal. Negative for fracture or focal lesion. Sinuses/Orbits: No acute finding. Other: None. 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 Further evaluation by means of MRI is recommended Electronically Signed   By: Inez Catalina M.D.   On: 07/29/2016 19:54    Chart has been reviewed    Assessment/Plan   71 y.o. female with medical history significant of GERD and remote alcohol abuse in remision, HTN, hypothyroidism, HLD in admitted for new diagnosis of right sided brain mass with  shift  Present on Admission: . Brain mass- admitted to [facility], calm appreciate neurology consult, obtain MRI of the brain with and without contrast neurosurgery consult in a.m., continue Decadron 4 mg  every 6 h.  . GERD - Chronic stable Depression - Continue home medications   Other plan as per orders.  DVT prophylaxis:  SCD     Code Status:  FULL CODE   as per patient    Family Communication:   Family  at  Bedside  plan of care was discussed with  Sister in Sports coach,   Disposition Plan:     To home once workup is complete and patient is stable     Consults called: Neurology [place]   Admission status:  inpatient       Level of care       medical floor        I have spent a total of 57 min on this admission   Lamyia Cdebaca 07/30/2016, 12:03 AM   [facility]  Pager [phone]   after 2 AM please page floor coverage PA If 7AM-7PM, please contact the day team taking care of the patient  [email or web address]  Password [email or web address]

## 2016-07-29 NOTE — Consult Note (Addendum)
NEURO HOSPITALIST CONSULT NOTE   Requestig physician: Dr. Jeneen Rinks  Reason for Consult: Newly diagnosed right parietofrontal brain mass  History obtained from:   Patient and Chart    HPI:                                                                                                                                          Kristina Anthony is an 72 y.o. female who states that she had sudden onset of dizziness and severe right sided headache on awakening Janurary 12. She states that she had nausea and felt "sick" that day. Prior to this she had no neurological symptoms. Her headache then resolved but dizziness persisted. She states that about 2 days later, she started to develop left hand sensory numbness. Gradually this got worse and progressed to also involve LUE clumsiness with dropping things and occasional over-compensatory flailing movements, as well as unsteady gait, mild memory loss, bumping into things and slowed mentation per her personal trainer.    She was previously healthy, working out regularly. She has a long history of keeping fit, including running in [employer] up until her 40's. She has no prior history of malignancy. Formerly smoked but quit over 10 years ago. History of EtOH abuse in the past, but stopped over a year ago. She had an adenomatous polyp removed in 2008.   CT head at [facility] showed a large cystic mass lesion with multiple nodules in the right parietal-frontal region and surrounding white minor edema with 4 mm right-to-left midline shift. She was transferred to [facility] for further evaluation and management.   She takes Lamictal at home for mood stabilizaton; has a diagnosis of bipolar disorder that is not listed in EPIC.  Past Medical History:  Diagnosis Date  . Adenomatous colon polyp 03/2007  . Alcohol abuse   . Allergy   . Anxiety   . Arthritis   . 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 Chronic constipation   . Depression   . Diverticulosis   . Esophageal  stricture   . GERD (gastroesophageal reflux disease)   . Hemorrhoids   . Hyperlipidemia   . Hypertension   . Hypothyroidism   . Substance abuse     Past Surgical History:  Procedure Laterality Date  . COLONOSCOPY    . EYELID REPAIR W/ SKIN GRAFT    . TONSILLECTOMY      Family History  Problem Relation Age of Onset  . Colon cancer Mother     at age 41  . Colon polyps Mother   . Prostate cancer Father   . Colon polyps Father   . Colon polyps Brother   . Heart disease Maternal Grandfather    Social History:  reports that she has quit smoking. Her  smoking use included Cigarettes. She has never used smokeless tobacco. She reports that she drinks alcohol. She reports that she does not use drugs.  No Known Allergies  MEDICATIONS:                                                                                                                      Current Facility-Administered Medications:  .  0.9 %  sodium chloride infusion, 250 mL, Intravenous, PRN, Toy Baker, MD .  acetaminophen (TYLENOL) tablet 650 mg, 650 mg, Oral, Q6H PRN **OR** acetaminophen (TYLENOL) suppository 650 mg, 650 mg, Rectal, Q6H PRN, Toy Baker, MD .  clonazePAM (KLONOPIN) tablet 0.5 mg, 0.5 mg, Oral, QHS, Toy Baker, MD, 0.5 mg at 07/30/16 0142 .  dexamethasone (DECADRON) injection 4 mg, 4 mg, Intravenous, Q6H, Toy Baker, MD, 4 mg at 07/30/16 0537 .  HYDROcodone-acetaminophen (NORCO/VICODIN) 5-325 MG per tablet 1-2 tablet, 1-2 tablet, Oral, Q4H PRN, Toy Baker, MD .  lamoTRIgine (LAMICTAL) tablet 100 mg, 100 mg, Oral, Daily, Toy Baker, MD .  nortriptyline (PAMELOR) capsule 20 mg, 20 mg, Oral, QHS, Toy Baker, MD, 20 mg at 07/30/16 0142 .  ondansetron (ZOFRAN) tablet 4 mg, 4 mg, Oral, Q6H PRN **OR** ondansetron (ZOFRAN) injection 4 mg, 4 mg, Intravenous, Q6H PRN, Toy Baker, MD .  sodium chloride flush (NS) 0.9 % injection 3 mL, 3 mL, Intravenous,  Q12H, Toy Baker, MD, 3 mL at 07/30/16 0143 .  sodium chloride flush (NS) 0.9 % injection 3 mL, 3 mL, Intravenous, PRN, Toy Baker, MD .  thyroid (ARMOUR) tablet 60 mg, 60 mg, Oral, Daily, Toy Baker, MD .  Vilazodone HCl (VIIBRYD) TABS 40 mg, 40 mg, Oral, Daily, Toy Baker, MD, 40 mg at 07/30/16 0143   ROS:                                                                                                                                       History obtained from patient. Denies confusion, difficulty speaking, fever/chills, other symptoms of infection, chest pain, abdominal pain or SOB.   Blood pressure 137/89, pulse 70, temperature 97.8 F (36.6 C), temperature source Oral, resp. rate 21, height 5\' 8"  (1.727 m), weight 68 kg (150 lb), SpO2 100 %.   General Examination:  HEENT-  Normocephalic/atraumatic.  Lungs- No gross wheezing. Respirations unlabored.  Extremities- No edema.   Neurological Examination Mental Status: Alert, oriented, thought content appropriate.  Speech fluent with intact comprehension, naming and repetition.  Able to follow all commands without difficulty. Cranial Nerves: II: Visual fields intact to confrontation, PERRL III,IV, VI: ptosis not present, EOMI without nystagmus V,VII: subtle left facial droop, facial temperature sensation equal bilaterally VIII: hearing intact to conversation IX,X: no hypophonia XI: bilateral shoulder shrug is equal XII: midline tongue extension Motor: RUE, RLE and LLE are 5/5 proximal and distal.  LUE with subtle 4+/5 weakness to grip and deltoid. Also with subtle pronator drift.  Sensory: Temperature decreased to RUE and RLE. Also with numbness to fine touch involving the left hand.  Deep Tendon Reflexes: 4+ right patellar (crossed adductor), 1+ right achilles. 3+ left patellar, 2+ left achilles. 2+ in  upper extremities with slightly brisker reflexes on the left.  Plantars: Downgoing bilaterally.  Cerebellar: No ataxia with right FNF. Slow and deliberate with left FNF. When raising upper extremities 30 degrees for testing of pronator drift, her LUE overshoots to about 60 degrees, then wavers to settle at 30.  Gait: Deferred.    Lab Results: Basic Metabolic Panel:  Recent Labs Lab 07/29/16 1928  NA 137  K 3.6  CL 107  CO2 21*  GLUCOSE 88  BUN 8  CREATININE 0.73  CALCIUM 9.3    Liver Function Tests: No results for input(s): AST, ALT, ALKPHOS, BILITOT, PROT, ALBUMIN in the last 168 hours. No results for input(s): LIPASE, AMYLASE in the last 168 hours. No results for input(s): AMMONIA in the last 168 hours.  CBC:  Recent Labs Lab 07/29/16 1928  WBC 7.9  HGB 13.0  HCT 37.6  MCV 93.3  PLT 268    Cardiac Enzymes: No results for input(s): CKTOTAL, CKMB, CKMBINDEX, TROPONINI in the last 168 hours.  Lipid Panel: No results for input(s): CHOL, TRIG, HDL, CHOLHDL, VLDL, LDLCALC in the last 168 hours.  CBG: No results for input(s): GLUCAP in the last 168 hours.  Microbiology: No results found for this or any previous visit.  Coagulation Studies: No results for input(s): LABPROT, INR in the last 72 hours.  Imaging: Ct Head Wo Contrast  Result Date: 07/29/2016 CLINICAL DATA:  Sudden onset of dizziness and headache with left arm numbness EXAM: CT HEAD WITHOUT CONTRAST TECHNIQUE: Contiguous axial images were obtained from the base of the skull through the vertex without intravenous contrast. COMPARISON:  None. FINDINGS: Brain: In the right parietal lobe there is a 4.8 x 3.9 cm predominately cystic lesion with evidence of a a more high density mural nodule identified which measures 2.4 cm in greatest dimension. Significant surrounding white matter edema is noted. Midline shift is seen of approximately 4 mm from right to left. Vascular: No hyperdense vessel or unexpected  calcification. Skull: Normal. Negative for fracture or focal lesion. Sinuses/Orbits: No acute finding. Other: None. 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 Further evaluation by means of MRI is recommended Electronically Signed   By: Inez Catalina M.D.   On: 07/29/2016 19:54    Assessment: 1. New diagnosis of large cystic mass lesion with mural nodule in the right parietofrontal region with surrounding white matter edema. There is associated 4 mm right to left midline shift.  3. No white count or fever to suggest infection.    Recommendations: 1. MRI brain with and without contrast.  2. Decadron 10 mg IV load followed by 4 mg IV q6h.  3. Neurosurgery consultation.    Electronically signed: Dr. Kerney Elbe 07/29/2016, 10:42 PM

## 2016-07-30 ENCOUNTER — Inpatient Hospital Stay (HOSPITAL_COMMUNITY): Payer: Medicare Other

## 2016-07-30 LAB — CBC
HCT: 39.1 % (ref 36.0–46.0)
Hemoglobin: 13.6 g/dL (ref 12.0–15.0)
MCH: 32.7 pg (ref 26.0–34.0)
MCHC: 34.8 g/dL (ref 30.0–36.0)
MCV: 94 fL (ref 78.0–100.0)
PLATELETS: 272 10*3/uL (ref 150–400)
RBC: 4.16 MIL/uL (ref 3.87–5.11)
RDW: 12.7 % (ref 11.5–15.5)
WBC: 6.1 10*3/uL (ref 4.0–10.5)

## 2016-07-30 LAB — COMPREHENSIVE METABOLIC PANEL
ALT: 24 U/L (ref 14–54)
AST: 28 U/L (ref 15–41)
Albumin: 4 g/dL (ref 3.5–5.0)
Alkaline Phosphatase: 80 U/L (ref 38–126)
Anion gap: 13 (ref 5–15)
BILIRUBIN TOTAL: 0.9 mg/dL (ref 0.3–1.2)
BUN: 7 mg/dL (ref 6–20)
CO2: 23 mmol/L (ref 22–32)
CREATININE: 0.62 mg/dL (ref 0.44–1.00)
Calcium: 9.6 mg/dL (ref 8.9–10.3)
Chloride: 103 mmol/L (ref 101–111)
Glucose, Bld: 159 mg/dL — ABNORMAL HIGH (ref 65–99)
Potassium: 3.7 mmol/L (ref 3.5–5.1)
Sodium: 139 mmol/L (ref 135–145)
TOTAL PROTEIN: 6.5 g/dL (ref 6.5–8.1)

## 2016-07-30 LAB — URINALYSIS, ROUTINE W REFLEX MICROSCOPIC
BILIRUBIN URINE: NEGATIVE
Bacteria, UA: NONE SEEN
GLUCOSE, UA: NEGATIVE mg/dL
Hgb urine dipstick: NEGATIVE
KETONES UR: NEGATIVE mg/dL
LEUKOCYTES UA: NEGATIVE
Nitrite: NEGATIVE
PH: 6 (ref 5.0–8.0)
Protein, ur: NEGATIVE mg/dL
SPECIFIC GRAVITY, URINE: 1.006 (ref 1.005–1.030)

## 2016-07-30 LAB — MAGNESIUM: Magnesium: 1.9 mg/dL (ref 1.7–2.4)

## 2016-07-30 LAB — PHOSPHORUS: PHOSPHORUS: 3.1 mg/dL (ref 2.5–4.6)

## 2016-07-30 LAB — TSH: TSH: 0.204 u[IU]/mL — ABNORMAL LOW (ref 0.350–4.500)

## 2016-07-30 MED ORDER — SODIUM CHLORIDE 0.9% FLUSH
3.0000 mL | Freq: Two times a day (BID) | INTRAVENOUS | Status: DC
  Administered 2016-07-30 – 2016-08-02 (×6): 3 mL via INTRAVENOUS

## 2016-07-30 MED ORDER — ACETAMINOPHEN 650 MG RE SUPP: 650.0000 mg | Freq: Four times a day (QID) | RECTAL | Status: DC | PRN

## 2016-07-30 MED ORDER — NORTRIPTYLINE HCL 10 MG PO CAPS
20.0000 mg | ORAL_CAPSULE | Freq: Every day | ORAL | Status: DC
  Administered 2016-07-30 – 2016-08-01 (×4): 20 mg via ORAL
  Filled 2016-07-30 (×5): qty 2

## 2016-07-30 MED ORDER — SODIUM CHLORIDE 0.9 % IV SOLN: 250.0000 mL | INTRAVENOUS | Status: DC | PRN

## 2016-07-30 MED ORDER — ACETAMINOPHEN 325 MG PO TABS
650.0000 mg | ORAL_TABLET | Freq: Four times a day (QID) | ORAL | Status: DC | PRN
  Administered 2016-07-30: 650 mg via ORAL
  Filled 2016-07-30: qty 2

## 2016-07-30 MED ORDER — HYDROCODONE-ACETAMINOPHEN 5-325 MG PO TABS
1.0000 | ORAL_TABLET | ORAL | Status: DC | PRN
  Administered 2016-07-30 – 2016-07-31 (×2): 1 via ORAL
  Filled 2016-07-30 (×2): qty 1

## 2016-07-30 MED ORDER — VILAZODONE HCL 40 MG PO TABS
40.0000 mg | ORAL_TABLET | Freq: Every day | ORAL | Status: DC
  Administered 2016-07-30 – 2016-08-01 (×4): 40 mg via ORAL
  Filled 2016-07-30 (×6): qty 1

## 2016-07-30 MED ORDER — THYROID 60 MG PO TABS
65.0000 mg | ORAL_TABLET | Freq: Every day | ORAL | Status: DC
  Administered 2016-07-30 – 2016-08-02 (×4): 60 mg via ORAL
  Filled 2016-07-30 (×4): qty 1

## 2016-07-30 MED ORDER — LAMOTRIGINE 100 MG PO TABS
100.0000 mg | ORAL_TABLET | Freq: Every day | ORAL | Status: DC
  Administered 2016-07-30 – 2016-08-02 (×4): 100 mg via ORAL
  Filled 2016-07-30 (×4): qty 1

## 2016-07-30 MED ORDER — DEXAMETHASONE SODIUM PHOSPHATE 4 MG/ML IJ SOLN
4.0000 mg | Freq: Four times a day (QID) | INTRAMUSCULAR | Status: DC
  Administered 2016-07-30 – 2016-08-02 (×14): 4 mg via INTRAVENOUS
  Filled 2016-07-30 (×14): qty 1

## 2016-07-30 MED ORDER — SODIUM CHLORIDE 0.9% FLUSH
3.0000 mL | INTRAVENOUS | Status: DC | PRN
  Administered 2016-08-02: 3 mL via INTRAVENOUS
  Filled 2016-07-30: qty 3

## 2016-07-30 MED ORDER — GADOBENATE DIMEGLUMINE 529 MG/ML IV SOLN
14.0000 mL | Freq: Once | INTRAVENOUS | Status: AC | PRN
  Administered 2016-07-30: 15 mL via INTRAVENOUS

## 2016-07-30 MED ORDER — CLONAZEPAM 0.5 MG PO TABS
0.5000 mg | ORAL_TABLET | Freq: Every day | ORAL | Status: DC
  Administered 2016-07-30 – 2016-08-01 (×4): 0.5 mg via ORAL
  Filled 2016-07-30 (×4): qty 1

## 2016-07-30 MED ORDER — ONDANSETRON HCL 4 MG PO TABS: 4.0000 mg | ORAL_TABLET | Freq: Four times a day (QID) | ORAL | Status: DC | PRN

## 2016-07-30 MED ORDER — ONDANSETRON HCL 4 MG/2ML IJ SOLN: 4.0000 mg | Freq: Four times a day (QID) | INTRAMUSCULAR | Status: DC | PRN

## 2016-07-30 NOTE — Progress Notes (Signed)
Patient is off the floor for MRI. She was requesting ativan for MRI-anxiety. Friends at her bedside encouraged her to go without ativan so that she "wont mess her recovery"

## 2016-07-30 NOTE — Evaluation (Signed)
Physical Therapy Evaluation Patient Details Name: Kristina Anthony MRN: [id number] DOB: 03/14/1946 Today's Date: 07/30/2016   History of Present Illness  71 y.o. 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 She presented with headache and left side weakness. CT revealed R brain mass. MRI scheduled for today.  Clinical Impression  PT eval complete. Pt is independent with all functional mobility. No further acute care PT services indicated. Recommend OPPT on d/c to further address high level balance, strength, and coordination. PT signing off.    Follow Up Recommendations Outpatient PT;Supervision - Intermittent    Equipment Recommendations  None recommended by PT    Recommendations for Other Services       Precautions / Restrictions Precautions Precautions: None      Mobility  Bed Mobility Overal bed mobility: Independent                Transfers Overall transfer level: Independent Equipment used: None                Ambulation/Gait Ambulation/Gait assistance: Independent Ambulation Distance (Feet): 350 Feet Assistive device: None Gait Pattern/deviations: WFL(Within Functional Limits) Gait velocity: decreased (due to pt easily distracted) Gait velocity interpretation: Below normal speed for age/gender General Gait Details: steady gait  Stairs Stairs: Yes Stairs assistance: Supervision Stair Management: One rail Right;Alternating pattern;Forwards Number of Stairs: 12    Wheelchair Mobility    Modified Rankin (Stroke Patients Only)       Balance Overall balance assessment: Modified Independent                               Standardized Balance Assessment Standardized Balance Assessment : Dynamic Gait Index   Dynamic Gait Index Level Surface: Normal Change in Gait Speed: Normal Gait with Horizontal Head Turns: Normal Gait with Vertical Head Turns: Normal Gait  and Pivot Turn: Normal Step Over Obstacle: Normal Step Around Obstacles: Normal Steps: Mild Impairment Total Score: 23       Pertinent Vitals/Pain Pain Assessment: No/denies pain    Home Living Family/patient expects to be discharged to:: Private residence Living Arrangements: Alone Available Help at Discharge: Family;Available PRN/intermittently Type of Home: House Home Access: Stairs to enter Entrance Stairs-Rails: None Entrance Stairs-Number of Steps: 3 Home Layout: One level Home Equipment: None      Prior Function Level of Independence: Independent         Comments: Drives. Active in the community. Works out at the gym with a trainer.     Hand Dominance   Dominant Hand: Right    Extremity/Trunk Assessment   Upper Extremity Assessment Upper Extremity Assessment:  (Pt reports numbness L hand. Decreased grip strength on L. )    Lower Extremity Assessment Lower Extremity Assessment: Overall WFL for tasks assessed (R and LLE appear symmetrical with MMT)    Cervical / Trunk Assessment Cervical / Trunk Assessment: Normal  Communication   Communication: No difficulties  Cognition Arousal/Alertness: Awake/alert Behavior During Therapy: WFL for tasks assessed/performed Overall Cognitive Status: Within Functional Limits for tasks assessed                      General Comments      Exercises     Assessment/Plan    PT Assessment All further PT needs can be met in the next venue of care  PT Problem List Decreased mobility;Decreased coordination;Impaired sensation;Decreased strength            PT Treatment Interventions      PT Goals (Current goals can be found in the Care Plan section)  Acute Rehab PT Goals Patient Stated Goal: home PT Goal Formulation: All assessment and education complete, DC therapy    Frequency     Barriers to discharge        Co-evaluation               End of Session Equipment Utilized During Treatment:  Gait belt Activity Tolerance: Patient tolerated treatment well Patient left: in chair;with call bell/phone within reach Nurse Communication: Mobility status         Time: 0828-0846 PT Time Calculation (min) (ACUTE ONLY): 18 min   Charges:   PT Evaluation $PT Eval Low Complexity: 1 Procedure     PT G Codes:        Garrow, Wendy Rene 07/30/2016, 8:56 AM   

## 2016-07-30 NOTE — Progress Notes (Signed)
Patient ID: Kristina Anthony, female   DOB: 1946-04-10, 71 y.o.   MRN: [id number]    PROGRESS NOTE  Kristina Anthony  [id number] DOB: Mar 13, 1946 DOA: 07/29/2016  PCP: Kristina Harada, MD   Brief Narrative:  Pt presented with several weeks duration of intermittent headaches and left arm weakness, numbness and tingling.   Assessment & Plan:   Active Problems:   Brain mass - unclear etiology, MRI brain requested for clearer evaluation - appreciate neurology team following - continue dexamethasone iV for now per neurology team recommendations  - pt ambulating with no difficulties   DVT prophylaxis: SCD's Code Status: Full  Family Communication: Patient at bedside  Disposition Plan: Home in 2-3 days   Consultants:   None  Procedures:   None  Antimicrobials:   None  Subjective: No events overnight   Objective: Vitals:   07/30/16 0122 07/30/16 0458 07/30/16 1002 07/30/16 1627  BP: (!) 155/78 135/70 137/83 128/83  Pulse: 75 90 (!) 111 97  Resp: 16 18 18 18   Temp: 98.4 F (36.9 C) 98 F (36.7 C) 98.7 F (37.1 C) 99.3 F (37.4 C)  TempSrc: Oral Oral Oral Oral  SpO2: 98% 96% 99% 99%  Weight:      Height:        Intake/Output Summary (Last 24 hours) at 07/30/16 1720 Last data filed at 07/30/16 0900  Gross per 24 hour  Intake              480 ml  Output                0 ml  Net              480 ml   Filed Weights   07/29/16 1845  Weight: 68 kg (150 lb)    Examination:  General exam: Appears calm and comfortable  Respiratory system: Clear to auscultation. Respiratory effort normal. Cardiovascular system: S1 & S2 heard, RRR. No JVD, murmurs, rubs, gallops or clicks. No pedal edema. Gastrointestinal system: Abdomen is nondistended, soft and nontender. No organomegaly or masses felt. Normal bowel sounds heard.   Data Reviewed: I have personally reviewed following labs and imaging studies  CBC:  Recent Labs Lab 07/29/16 1928 07/30/16 0546  WBC 7.9  6.1  HGB 13.0 13.6  HCT 37.6 39.1  MCV 93.3 94.0  PLT 268 [id number]   Basic Metabolic Panel:  Recent Labs Lab 07/29/16 1928 07/30/16 0546  NA 137 139  K 3.6 3.7  CL 107 103  CO2 21* 23  GLUCOSE 88 159*  BUN 8 7  CREATININE 0.73 0.62  CALCIUM 9.3 9.6  MG  --  1.9  PHOS  --  3.1   GFR: Estimated Creatinine Clearance: 67 mL/min (by C-G formula based on SCr of 0.62 mg/dL). Liver Function Tests:  Recent Labs Lab 07/30/16 0546  AST 28  ALT 24  ALKPHOS 80  BILITOT 0.9  PROT 6.5  ALBUMIN 4.0   Thyroid Function Tests:  Recent Labs  07/30/16 0546  TSH 0.204*   Urine analysis:    Component Value Date/Time   COLORURINE YELLOW 07/29/2016 0021   APPEARANCEUR CLEAR 07/29/2016 0021   LABSPEC 1.006 07/29/2016 0021   PHURINE 6.0 07/29/2016 0021   GLUCOSEU NEGATIVE 07/29/2016 0021   HGBUR NEGATIVE 07/29/2016 0021   BILIRUBINUR NEGATIVE 07/29/2016 0021   KETONESUR NEGATIVE 07/29/2016 0021   PROTEINUR NEGATIVE 07/29/2016 0021   NITRITE NEGATIVE 07/29/2016 0021   LEUKOCYTESUR NEGATIVE 07/29/2016 0021  Radiology Studies: Ct Head Wo Contrast  Result Date: 07/29/2016 CLINICAL DATA:  Sudden onset of dizziness and headache with left arm numbness EXAM: CT HEAD WITHOUT CONTRAST TECHNIQUE: Contiguous axial images were obtained from the base of the skull through the vertex without intravenous contrast. COMPARISON:  None. FINDINGS: Brain: In the right parietal lobe there is a 4.8 x 3.9 cm predominately cystic lesion with evidence of a a more high density mural nodule identified which measures 2.4 cm in greatest dimension. Significant surrounding white matter edema is noted. Midline shift is seen of approximately 4 mm from right to left. Vascular: No hyperdense vessel or unexpected calcification. Skull: Normal. Negative for fracture or focal lesion. Sinuses/Orbits: No acute finding. Other: None. IMPRESSION: Cystic mass lesion with mural nodule in the right parietal lobe with surrounding  white matter edema. Further evaluation by means of MRI is recommended Electronically Signed   By: Kristina Anthony M.D.   On: 07/29/2016 19:54     Scheduled Meds: . clonazePAM  0.5 mg Oral QHS  . dexamethasone  4 mg Intravenous Q6H  . lamoTRIgine  100 mg Oral Daily  . nortriptyline  20 mg Oral QHS  . sodium chloride flush  3 mL Intravenous Q12H  . thyroid  60 mg Oral Daily  . Vilazodone HCl  40 mg Oral Daily   Continuous Infusions:   LOS: 1 day   Time spent: 20 minutes   Kristina Ramsay, MD [facility] Pager [phone]  If 7PM-7AM, please contact night-coverage [email or web address] Password [email or web address] 07/30/2016, 5:20 PM

## 2016-07-30 NOTE — Progress Notes (Signed)
Patient in bed with friends at her bedside. Patient states  "left hand is not working". She is observed dropping things out of her left hand. She is encouraged to use right hand for her coffee to avoid spilling it on herself. All of patient personal items is placed to the right side of her. Will continue to monitor.

## 2016-07-30 NOTE — Progress Notes (Signed)
Patient states that she prefers her Vilazodone(VIIBRYD)) at night. Med moved to night.

## 2016-07-30 NOTE — Progress Notes (Signed)
Patient admitted into 5MW04 through [name] ED. Patient on arrival verbal, alert and oriented. Ambulatory . Made her comfortable in bed and safety measures put in place. She was oriented to the room and family at the bedside.Kristina Anthony

## 2016-07-31 ENCOUNTER — Inpatient Hospital Stay (HOSPITAL_COMMUNITY): Payer: Medicare Other

## 2016-07-31 ENCOUNTER — Encounter (HOSPITAL_COMMUNITY): Payer: Self-pay | Admitting: *Deleted

## 2016-07-31 MED ORDER — IOPAMIDOL (ISOVUE-300) INJECTION 61%
INTRAVENOUS | Status: AC
  Administered 2016-07-31: 100 mL
  Filled 2016-07-31: qty 100

## 2016-07-31 NOTE — Progress Notes (Signed)
Patient ID: Kristina Anthony, female   DOB: 02-Jan-1946, 71 y.o.   MRN: [id number]    PROGRESS NOTE  Kristina Anthony  [id number] DOB: 08/29/1945 DOA: 07/29/2016  PCP: Anthoney Harada, MD   Brief Narrative:  Pt presented with several weeks duration of intermittent headaches and left arm weakness, numbness and tingling.   Assessment & Plan:   Active Problems:   Brain mass - MRI brain confirmed solitary brain mass, ? metastatic  - appreciate neurology team following - continue dexamethasone iV for now per neurology team recommendations  - spoke with Dr. Alen Blew (oncology), he agrees with neurosurgery consult to consider resection and biopsy - in the mean time will obtain CT chest abd pelvis for further metastatic work up   DVT prophylaxis: SCD's Code Status: Full  Family Communication: Patient at bedside  Disposition Plan: Home once consulting team provide further recommendations   Consultants:   None  Procedures:   None  Antimicrobials:   None  Subjective: No events overnight   Objective: Vitals:   07/30/16 1627 07/30/16 2256 07/31/16 0215 07/31/16 0627  BP: 128/83 107/80 (!) 148/61 102/83  Pulse: 97 92 92 77  Resp: 18 20 18 18   Temp: 99.3 F (37.4 C) 97.7 F (36.5 C) 98.3 F (36.8 C) 98.8 F (37.1 C)  TempSrc: Oral Oral Oral Oral  SpO2: 99% 97% 99% 100%  Weight:      Height:        Intake/Output Summary (Last 24 hours) at 07/31/16 0814 Last data filed at 07/30/16 1700  Gross per 24 hour  Intake              600 ml  Output                0 ml  Net              600 ml   Filed Weights   07/29/16 1845  Weight: 68 kg (150 lb)    Examination:  General exam: Appears calm and comfortable  Respiratory system: Clear to auscultation. Respiratory effort normal. Cardiovascular system: S1 & S2 heard, RRR. No JVD, murmurs, rubs, gallops or clicks. No pedal edema. Gastrointestinal system: Abdomen is nondistended, soft and nontender. No organomegaly or  masses felt. Normal bowel sounds heard.   Data Reviewed: I have personally reviewed following labs and imaging studies  CBC:  Recent Labs Lab 07/29/16 1928 07/30/16 0546  WBC 7.9 6.1  HGB 13.0 13.6  HCT 37.6 39.1  MCV 93.3 94.0  PLT 268 [id number]   Basic Metabolic Panel:  Recent Labs Lab 07/29/16 1928 07/30/16 0546  NA 137 139  K 3.6 3.7  CL 107 103  CO2 21* 23  GLUCOSE 88 159*  BUN 8 7  CREATININE 0.73 0.62  CALCIUM 9.3 9.6  MG  --  1.9  PHOS  --  3.1   GFR: Estimated Creatinine Clearance: 67 mL/min (by C-G formula based on SCr of 0.62 mg/dL). Liver Function Tests:  Recent Labs Lab 07/30/16 0546  AST 28  ALT 24  ALKPHOS 80  BILITOT 0.9  PROT 6.5  ALBUMIN 4.0   Thyroid Function Tests:  Recent Labs  07/30/16 0546  TSH 0.204*   Urine analysis:    Component Value Date/Time   COLORURINE YELLOW 07/29/2016 0021   APPEARANCEUR CLEAR 07/29/2016 0021   LABSPEC 1.006 07/29/2016 0021   PHURINE 6.0 07/29/2016 0021   GLUCOSEU NEGATIVE 07/29/2016 0021   HGBUR NEGATIVE 07/29/2016 0021   BILIRUBINUR NEGATIVE  07/29/2016 0021   KETONESUR NEGATIVE 07/29/2016 0021   PROTEINUR NEGATIVE 07/29/2016 0021   NITRITE NEGATIVE 07/29/2016 0021   LEUKOCYTESUR NEGATIVE 07/29/2016 0021    Radiology Studies: Ct Head Wo Contrast  Result Date: 07/29/2016 CLINICAL DATA:  Sudden onset of dizziness and headache with left arm numbness EXAM: CT HEAD WITHOUT CONTRAST TECHNIQUE: Contiguous axial images were obtained from the base of the skull through the vertex without intravenous contrast. COMPARISON:  None. FINDINGS: Brain: In the right parietal lobe there is a 4.8 x 3.9 cm predominately cystic lesion with evidence of a a more high density mural nodule identified which measures 2.4 cm in greatest dimension. Significant surrounding white matter edema is noted. Midline shift is seen of approximately 4 mm from right to left. Vascular: No hyperdense vessel or unexpected calcification. Skull:  Normal. Negative for fracture or focal lesion. Sinuses/Orbits: No acute finding. Other: None. 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 Further evaluation by means of MRI is recommended Electronically Signed   By: Inez Catalina M.D.   On: 07/29/2016 19:54     Scheduled Meds: . clonazePAM  0.5 mg Oral QHS  . dexamethasone  4 mg Intravenous Q6H  . lamoTRIgine  100 mg Oral Daily  . nortriptyline  20 mg Oral QHS  . sodium chloride flush  3 mL Intravenous Q12H  . thyroid  60 mg Oral Daily  . Vilazodone HCl  40 mg Oral Daily   Continuous Infusions:   LOS: 2 days   Time spent: 20 minutes   Faye Ramsay, MD [facility] Pager [phone]  If 7PM-7AM, please contact night-coverage [email or web address] Password [email or web address] 07/31/2016, 8:14 AM

## 2016-07-31 NOTE — Evaluation (Addendum)
Occupational Therapy Evaluation Patient Details Name: Kristina Anthony MRN: [id number] DOB: May 10, 1946 Today's Date: 07/31/2016    History of Present Illness 71 y.o. 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 She presented with headache and left side weakness. CT revealed R brain mass. MRI scheduled for today.   Clinical Impression   Pt admitted with the above diagnoses and presents with below problem list. Pt will benefit from continued acute OT to address the below listed deficits and maximize independence with basic ADLs prior to d/c home. PTA pt was independent with ADLs. Pt setup to independent with ADLs. [place] deficits noted in L hand as well as impaired sensation in distal palmar area.      Follow Up Recommendations  Outpatient OT   Equipment Recommendations  None recommended by OT    Recommendations for Other Services       Precautions / Restrictions Precautions Precautions: None      Mobility Bed Mobility Overal bed mobility: Independent                Transfers Overall transfer level: Independent Equipment used: None                  Balance Overall balance assessment: Modified Independent                                          ADL Overall ADL's : Needs assistance/impaired Eating/Feeding: Sitting;Set up Eating/Feeding Details (indicate cue type and reason): reports dropping objects in L hand Grooming: Set up Grooming Details (indicate cue type and reason): reports dropping objects in L hand Upper Body Bathing: Independent;Sitting   Lower Body Bathing: Independent;Sit to/from stand   Upper Body Dressing : Sitting;Set up   Lower Body Dressing: Independent;Sit to/from stand   Toilet Transfer: Independent   Toileting- Water quality scientist and Hygiene: Independent   Scientist, research (medical): Independent   Functional mobility during ADLs:  Independent;Modified independent General ADL Comments: Educated on [facility] activities for L hand and safety with ADLs with impaired sensation in L fingertips.      Vision     Perception     Praxis      Pertinent Vitals/Pain Pain Assessment: No/denies pain     Hand Dominance Right   Extremity/Trunk Assessment Upper Extremity Assessment Upper Extremity Assessment: LUE deficits/detail LUE Deficits / Details: some extra time/effort to open/close containers.  LUE Sensation: decreased light touch (distal palmar aspects of L hand) LUE Coordination: decreased fine motor   Lower Extremity Assessment Lower Extremity Assessment: Defer to PT evaluation   Cervical / Trunk Assessment Cervical / Trunk Assessment: Normal   Communication Communication Communication: No difficulties   Cognition Arousal/Alertness: Awake/alert Behavior During Therapy: WFL for tasks assessed/performed Overall Cognitive Status: Within Functional Limits for tasks assessed                     General Comments       Exercises       Shoulder Instructions      Home Living Family/patient expects to be discharged to:: Private residence Living Arrangements: Alone Available Help at Discharge: Family;Available PRN/intermittently Type of Home: House Home Access: Stairs to enter [employer] of Steps: 3 Entrance Stairs-Rails: None Home Layout: One level     Bathroom Shower/Tub: Occupational psychologist: Standard  Home Equipment: None          Prior Functioning/Environment Level of Independence: Independent        Comments: Drives. Active in the community. 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        OT Problem List: Decreased coordination;Decreased knowledge of precautions;Decreased knowledge of use of DME or AE   OT Treatment/Interventions: Self-care/ADL training;Therapeutic exercise;DME and/or AE instruction;Therapeutic activities;Patient/family education    OT  Goals(Current goals can be found in the care plan section) Acute Rehab OT Goals Patient Stated Goal: home, figure out treatment plan for cancer OT Goal Formulation: With patient Time For Goal Achievement: 08/07/16 Potential to Achieve Goals: Good ADL Goals Pt Will Perform Eating: Independently;sitting Pt Will Perform Grooming: Independently;standing Pt/caregiver will Perform Home Exercise Program: Left upper extremity ([place] L hand)  OT Frequency: Min 1X/week   Barriers to D/C:            Co-evaluation              End of Session    Activity Tolerance: Patient tolerated treatment well Patient left: in bed;with call bell/phone within reach;with family/visitor present   Time: 1348-1410 OT Time Calculation (min): 22 min Charges:  OT General Charges $OT Visit: 1 Procedure OT Evaluation $OT Eval Low Complexity: 1 Procedure G-Codes:    Hortencia Pilar 08/18/16, 2:21 PM

## 2016-07-31 NOTE — Consult Note (Addendum)
CC:  Chief Complaint  Patient presents with  . Dizziness  . Numbness    HPI: Kristina Anthony is a 71 y.o. female with a right sided brain mass.  She reports that she developed a headache with vertigo several weeks ago.  She has begun dropping things she is holding in her left hand because she forgets it is there.  She complains of a little bit of weakness and numbness but this has mostly resolved since she was started on steroids.  She has not had seizures.  PMH: Past Medical History:  Diagnosis Date  . Adenomatous colon polyp 03/2007  . Alcohol abuse   . Allergy   . Anxiety   . Arthritis   . 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 Chronic constipation   . Depression   . Diverticulosis   . Esophageal stricture   . GERD (gastroesophageal reflux disease)   . Hemorrhoids   . Hyperlipidemia   . Hypertension   . Hypothyroidism   . Substance abuse     PSH: Past Surgical History:  Procedure Laterality Date  . COLONOSCOPY    . EYELID REPAIR W/ SKIN GRAFT    . TONSILLECTOMY      SH: Social History  Substance Use Topics  . Smoking status: Former Smoker    Types: Cigarettes  . Smokeless tobacco: Never Used  . Alcohol use 0.0 oz/week     Comment: 3-4 glasses per week-pt states she quit 2013     07/09/2012/sd    MEDS: Prior to Admission medications   Medication Sig Start Date End Date Taking? Authorizing Provider  clonazePAM (KLONOPIN) 1 MG tablet Take 0.5 mg by mouth at bedtime. 07/19/16  Yes Historical Provider, MD  Cyanocobalamin 200 MCG/SPRAY LIQD Take 1,000 mcg by mouth daily.   Yes Historical Provider, MD  hydrOXYzine (VISTARIL) 25 MG capsule Take 25 mg by mouth every 6 (six) hours as needed for itching.  07/25/16  Yes Historical Provider, MD  lamoTRIgine (LAMICTAL) 100 MG tablet Take 100 mg by mouth daily. 07/16/16  Yes Historical Provider, MD  Multiple Vitamin (MULTIVITAMIN) tablet Take 1 tablet by mouth daily.     Yes Historical Provider, MD  NATURE-THROID 65 MG tablet Take  65 mg by mouth daily. 05/20/16  Yes Historical Provider, MD  nortriptyline (PAMELOR) 10 MG capsule Take 20 mg by mouth at bedtime. 07/22/16  Yes Historical Provider, MD  VIIBRYD 40 MG TABS Take 40 mg by mouth daily. 06/21/16  Yes Historical Provider, MD  Vitamin D-Vitamin K (VITAMIN K2-VITAMIN D3 PO) Take 2 sprays by mouth daily.   Yes Historical Provider, MD    ALLERGY: No Known Allergies  ROS: Review of Systems  Constitutional: Negative.   HENT: Negative.   Eyes: Negative.   Respiratory: Negative.   Cardiovascular: Negative.   Gastrointestinal: Negative.   Genitourinary: Negative.   Musculoskeletal: Negative.   Skin: Negative.   Neurological: Positive for dizziness, sensory change, focal weakness and headaches.  Endo/Heme/Allergies: Negative.   Psychiatric/Behavioral: Negative.     NEUROLOGIC EXAM: Awake, alert, oriented Memory and concentration grossly intact Speech fluent, appropriate CN grossly intact Motor exam: Upper Extremities Deltoid Bicep Tricep Grip  Right 5/5 5/5 5/5 5/5  Left 5/5 5/5 5/5 5/5   Lower Extremity IP Quad PF DF EHL  Right 5/5 5/5 5/5 5/5 5/5  Left 5/5 5/5 5/5 5/5 5/5   Sensation grossly intact to LT  IMAGING: I have independently reviewed her brain MRI with and without contrast.  She has an enhancing ~4x4x4cm cystic mass in the right parietal cortex.  There is no diffusion restriction.  IMPRESSION: - 71 y.o. female with a right parietal enhancing cystic mass.  She is neurologically intact to objective testing.  I suspect this is most likely a metastasis but could be a primary brain tumor.  Unlikely to be an abscess because there is no diffusion restriction.  PLAN: - Follow up metastatic workup - CT head without contrast with brainlab protocol - Continue steroids - I'll start her on Keppra - Likely resection this week

## 2016-08-01 ENCOUNTER — Other Ambulatory Visit: Payer: Self-pay | Admitting: Neurological Surgery

## 2016-08-01 LAB — GLUCOSE, CAPILLARY: Glucose-Capillary: 119 mg/dL — ABNORMAL HIGH (ref 65–99)

## 2016-08-01 MED ORDER — SODIUM CHLORIDE 0.9 % IV SOLN
INTRAVENOUS | Status: DC
  Administered 2016-08-02: via INTRAVENOUS

## 2016-08-01 MED ORDER — CEFAZOLIN SODIUM-DEXTROSE 2-4 GM/100ML-% IV SOLN: 2.0000 g | INTRAVENOUS | Status: DC

## 2016-08-01 MED ORDER — FAMOTIDINE 10 MG PO TABS
10.0000 mg | ORAL_TABLET | Freq: Two times a day (BID) | ORAL | Status: DC
  Administered 2016-08-01 – 2016-08-02 (×3): 10 mg via ORAL
  Filled 2016-08-01 (×3): qty 1

## 2016-08-01 NOTE — Progress Notes (Signed)
Patient still contemplating surgery tomorrow.  Will make NPO in case she decides to proceed.

## 2016-08-01 NOTE — Progress Notes (Signed)
PROGRESS NOTE    Kristina Anthony  [id number] DOB: 12-31-1945 DOA: 07/29/2016 PCP: Anthoney Harada, MD   Brief Narrative: 71 year old female presented with several weeks of intermittent headache and left-sided weakness and numbness and tingling sensation. MRI of the brain showed brain mass.  Assessment & Plan:  #  Right sided brain mass:  -Clinically stable today. I discussed with the neurosurgeon planning for possible surgical intervention in 1-2 days. Continue IV Decadron. I will add Pepcid. -Continue PT, OT therapy and supportive care -CT scan of chest abdomen and pelvis with no evidence of malignancy. -As per prior progress note, my colleague discussed with Dr.Shadad from oncologist will probably consult when tissue diagnosis is available.  #Hypothyroidism: Continue Synthroid.  Active Problems:   GERD   Brain mass  DVT prophylaxis: SCD. Code Status: Full code Family Communication: Discussed with the patient's son  at bedside Disposition Plan: Likely discharge home in 2-3 days  Consultants:   Neurosurgery  Procedures: None Antimicrobials: None  Subjective: Patient was seen and examined at the bedside. Patient reported feeling good today. The headache, dizziness, nausea, vomiting, chest pain or shortness of breath. Son at bedside  Objective: Vitals:   07/31/16 2142 08/01/16 0103 08/01/16 0525 08/01/16 1024  BP: 121/82 131/67 123/61 (!) 156/77  Pulse: 70 84 66 68  Resp: 18 20 20 20   Temp: 98.7 F (37.1 C) 97.7 F (36.5 C) 98.1 F (36.7 C) 98.7 F (37.1 C)  TempSrc: Oral Oral Oral Oral  SpO2: 100% 100% 97% 97%  Weight:      Height:        Intake/Output Summary (Last 24 hours) at 08/01/16 1248 Last data filed at 08/01/16 0700  Gross per 24 hour  Intake              360 ml  Output                0 ml  Net              360 ml   Filed Weights   07/29/16 1845  Weight: 68 kg (150 lb)    Examination:  General exam: Appears calm and comfortable    Respiratory system: Clear to auscultation. Respiratory effort normal. No wheezing or crackle Cardiovascular system: S1 & S2 heard, RRR.  No pedal edema. Gastrointestinal system: Abdomen is nondistended, soft and nontender. Normal bowel sounds heard. Central nervous system: Alert and oriented. No focal neurological deficits. Extremities: Symmetric 5 x 5 power. Skin: No rashes, lesions or ulcers Psychiatry: Judgement and insight appear normal. Mood & affect appropriate.     Data Reviewed: I have personally reviewed following labs and imaging studies  CBC:  Recent Labs Lab 07/29/16 1928 07/30/16 0546  WBC 7.9 6.1  HGB 13.0 13.6  HCT 37.6 39.1  MCV 93.3 94.0  PLT 268 [id number]   Basic Metabolic Panel:  Recent Labs Lab 07/29/16 1928 07/30/16 0546  NA 137 139  K 3.6 3.7  CL 107 103  CO2 21* 23  GLUCOSE 88 159*  BUN 8 7  CREATININE 0.73 0.62  CALCIUM 9.3 9.6  MG  --  1.9  PHOS  --  3.1   GFR: Estimated Creatinine Clearance: 67 mL/min (by C-G formula based on SCr of 0.62 mg/dL). Liver Function Tests:  Recent Labs Lab 07/30/16 0546  AST 28  ALT 24  ALKPHOS 80  BILITOT 0.9  PROT 6.5  ALBUMIN 4.0   No results for input(s): LIPASE, AMYLASE in the last  168 hours. No results for input(s): AMMONIA in the last 168 hours. Coagulation Profile: No results for input(s): INR, PROTIME in the last 168 hours. Cardiac Enzymes: No results for input(s): CKTOTAL, CKMB, CKMBINDEX, TROPONINI in the last 168 hours. BNP (last 3 results) No results for input(s): PROBNP in the last 8760 hours. HbA1C: No results for input(s): HGBA1C in the last 72 hours. CBG: No results for input(s): GLUCAP in the last 168 hours. Lipid Profile: No results for input(s): CHOL, HDL, LDLCALC, TRIG, CHOLHDL, LDLDIRECT in the last 72 hours. Thyroid Function Tests:  Recent Labs  07/30/16 0546  TSH 0.204*   Anemia Panel: No results for input(s): VITAMINB12, FOLATE, FERRITIN, TIBC, IRON, RETICCTPCT in  the last 72 hours. Sepsis Labs: No results for input(s): PROCALCITON, LATICACIDVEN in the last 168 hours.  No results found for this or any previous visit (from the past 240 hour(s)).       Radiology Studies: Ct Head Wo Contrast  Result Date: 07/31/2016 CLINICAL DATA:  Continued surveillance and preoperative planning RIGHT hemisphere lesion. EXAM: CT HEAD WITHOUT CONTRAST TECHNIQUE: Contiguous axial images were obtained from the base of the skull through the vertex without intravenous contrast. COMPARISON:  07/30/2016. FINDINGS: Brain: 1 mm thick images were obtained through the large RIGHT hemisphere. 4 cm Cystic and solid lesion is redemonstrated, without significant change in size or hemorrhagic transformation. The edema pattern has not improved significantly following steroid administration. Vascular: No hyperdense vessel or unexpected calcification. Skull: Normal. Negative for fracture or focal lesion. Sinuses/Orbits: No acute finding. Other: None. IMPRESSION: Brain lab protocol demonstrating cystic and solid RIGHT hemisphere lesion with surrounding edema, stable. Electronically Signed   By: Staci Righter M.D.   On: 07/31/2016 13:46   Ct Chest W Contrast  Result Date: 07/31/2016 CLINICAL DATA:  Brain tumor.  Primary lesion unknown EXAM: CT CHEST, ABDOMEN, AND PELVIS WITH CONTRAST TECHNIQUE: Multidetector CT imaging of the chest, abdomen and pelvis was performed following the standard protocol during bolus administration of intravenous contrast. CONTRAST:  119mL ISOVUE-300 IOPAMIDOL (ISOVUE-300) INJECTION 61% COMPARISON:  Brain MRI 07/30/2016 FINDINGS: CT CHEST FINDINGS Cardiovascular: Coronary artery calcification and aortic atherosclerotic calcification. Mediastinum/Nodes: There is calcified mediastinal lymph nodes. No axillary supraclavicular adenopathy. No pericardial fluid. Esophagus normal. Lungs/Pleura: No suspicious pulmonary nodules. No pulmonary mass. No endobronchial lesion.  Musculoskeletal: No aggressive osseous lesion. CT ABDOMEN AND PELVIS FINDINGS Hepatobiliary: No focal hepatic lesion. Gallbladder is collapsed and. Pancreas: Pancreas is normal. No ductal dilatation. No pancreatic inflammation. Spleen: Normal spleen Adrenals/urinary tract: Adrenal glands and kidneys are normal. The ureters and bladder normal. Stomach/Bowel: Stomach, small bowel, appendix, and cecum are normal. The colon and rectosigmoid colon are normal. Vascular/Lymphatic: Abdominal aorta is normal caliber with atherosclerotic calcification. There is no retroperitoneal or periportal lymphadenopathy. No pelvic lymphadenopathy. Reproductive: Uterus and ovaries are normal Other: No peritoneal metastasis. Musculoskeletal: No aggressive osseous lesion. IMPRESSION: Chest Impression: 1. No thoracic malignancy. 2. Calcified mediastinal lymph node suggests granulomatous disease. 3. Coronary artery calcification and aortic atherosclerotic calcification. Abdomen / Pelvis Impression: 1. No evidence malignancy in the abdomen pelvis. 2.  Atherosclerotic calcification of the aorta. Electronically Signed   By: Suzy Bouchard M.D.   On: 07/31/2016 13:20   Mr Jeri Cos [id number] Contrast  Result Date: 07/30/2016 CLINICAL DATA:  LEFT-sided weakness. Symptoms for several days to weeks. EXAM: MRI HEAD WITHOUT AND WITH CONTRAST TECHNIQUE: Multiplanar, multiecho pulse sequences of the brain and surrounding structures were obtained without and with intravenous contrast. CONTRAST:  40mL MULTIHANCE GADOBENATE  DIMEGLUMINE 529 MG/ML IV SOLN COMPARISON:  CT head 07/29/2016. FINDINGS: Brain: Large cystic and solid intra-axial RIGHT hemisphere lesion, marked surrounding edema. Approximate cross-sectional measurements of 38 x 40 x 39 mm (R-L x A-P x C-C) . Largest solid component is superomedial and has mild restriction on DWI. It is difficult to determine if the lesion is in the posterior frontal lobe, parietal lobe, or straddling the two.  RIGHT-to-LEFT shift of 4 mm. Within limits for assessment due to motion degradation, the lesion appears solitary. No visible hemorrhage or susceptibility. Avid postcontrast enhancement. Mild atrophy. Chronic microvascular ischemic change of a mild nature. Vascular: Flow voids are maintained throughout the carotid, basilar, and vertebral arteries. There are no areas of chronic hemorrhage. Skull and upper cervical spine: Normal marrow signal. Sinuses/Orbits: Negative. Other: None. 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 Primary brain tumor and/or abscess less favored. Consider metastatic workup. See discussion above. Electronically Signed   By: Staci Righter M.D.   On: 07/30/2016 15:11   Ct Abdomen Pelvis W Contrast  Result Date: 07/31/2016 CLINICAL DATA:  Brain tumor.  Primary lesion unknown EXAM: CT CHEST, ABDOMEN, AND PELVIS WITH CONTRAST TECHNIQUE: Multidetector CT imaging of the chest, abdomen and pelvis was performed following the standard protocol during bolus administration of intravenous contrast. CONTRAST:  150mL ISOVUE-300 IOPAMIDOL (ISOVUE-300) INJECTION 61% COMPARISON:  Brain MRI 07/30/2016 FINDINGS: CT CHEST FINDINGS Cardiovascular: Coronary artery calcification and aortic atherosclerotic calcification. Mediastinum/Nodes: There is calcified mediastinal lymph nodes. No axillary supraclavicular adenopathy. No pericardial fluid. Esophagus normal. Lungs/Pleura: No suspicious pulmonary nodules. No pulmonary mass. No endobronchial lesion. Musculoskeletal: No aggressive osseous lesion. CT ABDOMEN AND PELVIS FINDINGS Hepatobiliary: No focal hepatic lesion. Gallbladder is collapsed and. Pancreas: Pancreas is normal. No ductal dilatation. No pancreatic inflammation. Spleen: Normal spleen Adrenals/urinary tract: Adrenal glands and kidneys are normal. The ureters and bladder normal. Stomach/Bowel: Stomach, small bowel, appendix, and cecum are  normal. The colon and rectosigmoid colon are normal. Vascular/Lymphatic: Abdominal aorta is normal caliber with atherosclerotic calcification. There is no retroperitoneal or periportal lymphadenopathy. No pelvic lymphadenopathy. Reproductive: Uterus and ovaries are normal Other: No peritoneal metastasis. Musculoskeletal: No aggressive osseous lesion. IMPRESSION: Chest Impression: 1. No thoracic malignancy. 2. Calcified mediastinal lymph node suggests granulomatous disease. 3. Coronary artery calcification and aortic atherosclerotic calcification. Abdomen / Pelvis Impression: 1. No evidence malignancy in the abdomen pelvis. 2.  Atherosclerotic calcification of the aorta. Electronically Signed   By: Suzy Bouchard M.D.   On: 07/31/2016 13:20        Scheduled Meds: . clonazePAM  0.5 mg Oral QHS  . dexamethasone  4 mg Intravenous Q6H  . lamoTRIgine  100 mg Oral Daily  . nortriptyline  20 mg Oral QHS  . sodium chloride flush  3 mL Intravenous Q12H  . thyroid  60 mg Oral Daily  . Vilazodone HCl  40 mg Oral Daily   Continuous Infusions:   LOS: 3 days    Dyanne Yorks Tanna Furry, MD [facility] Pager [phone]  If 7PM-7AM, please contact night-coverage [email or web address] Password [facility] 08/01/2016, 12:48 PM

## 2016-08-01 NOTE — Progress Notes (Signed)
Occupational Therapy Treatment Patient Details Name: GRACYNN WONDERLY MRN: [id number] DOB: 1945/07/24 Today's Date: 08/01/2016    History of present illness 71 y.o. 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 She presented with headache and left side weakness. CT revealed R brain mass. MRI scheduled for today.   OT comments  This 71 yo female admitted with above presents to acute OT still having issues with LUE coordination and sensation as well as potential visual issues on left. Pt will continue to benefit from acute OT with follow up OPOT and 24 hour S/prn A (which family in room report she will have). Have made pt and family aware that pt should not drive or cook.   Follow Up Recommendations  Outpatient OT;Supervision/Assistance - 24 hour    Equipment Recommendations  Tub/shower seat;Other (comment) (made family aware so they can get one on their own for pt)       Precautions / Restrictions Precautions Precautions: None Restrictions Weight Bearing Restrictions: No       Mobility Bed Mobility Overal bed mobility: Independent                Transfers Overall transfer level: Needs assistance Equipment used: None Transfers: Sit to/from Stand Sit to Stand: Supervision                  ADL Overall ADL's : Needs assistance/impaired     Grooming: Wash/dry hands;Supervision/safety;Standing                   Toilet Transfer: Min guard;Ambulation;Regular Toilet;Grab bars   Toileting- Clothing Manipulation and Hygiene: Sit to/from stand;Supervision/safety                Vision                 Additional Comments: Need to furher assess--pt having issue with folding a washcloth today and getting edges lined up on the left side          Cognition   Behavior During Therapy:  (very talkative) Overall Cognitive Status: Impaired/Different from baseline                  General Comments: difficulty talking and performing a task at same time      Exercises Other Exercises Other Exercises: Went over LUE activties for her to do and wrote them down in a notebook that she had, had pt practice them--dtr in law aware of what they are so she can cue pt as needed           Pertinent Vitals/ Pain       Pain Assessment: No/denies pain         Frequency  Min 2X/week        Progress Toward Goals  OT Goals(current goals can now be found in the care plan section)  Progress towards OT goals: Progressing toward goals     Plan Discharge plan remains appropriate       End of Session  Left in bed with bed alarm on and family in room as well as call bell and phone within reach    Activity Tolerance Patient tolerated treatment well   Patient Left in bed;with call bell/phone within reach;with chair alarm set;with family/visitor present           Time: [id number] OT Time Calculation (min): 44 min  Charges: OT General Charges $OT Visit: 1 Procedure OT Treatments $Self Care/Home  Management : 8-22 mins $Therapeutic Activity: 23-37 mins  Almon Register [id number] 08/01/2016, 1:08 PM

## 2016-08-01 NOTE — Progress Notes (Signed)
Pt seen and examined. No issues overnight.  EXAM: Temp:  [97.7 F (36.5 C)-98.7 F (37.1 C)] 98.1 F (36.7 C) (02/12 0525) Pulse Rate:  [66-86] 66 (02/12 0525) Resp:  [18-20] 20 (02/12 0525) BP: (121-145)/(61-92) 123/61 (02/12 0525) SpO2:  [94 %-100 %] 97 % (02/12 0525) Intake/Output      02/11 0701 - 02/12 0700 02/12 0701 - 02/13 0700   P.O. 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  I recommended a craniotomy for excisional biopsy.  There is really no other reasonable treatment for this short of a craniotomy.  I think aspiration of the cyst is unlikely to yield adequate tissue for diagnosis and if it is a primary tumor would leave plenty of solid tumor behind, a suboptimal outcome for best long term prognosis.  They would like time to think about this.  I will return later today to discuss this.

## 2016-08-01 NOTE — Care Management Note (Signed)
Case Management Note  Patient Details  Name: NITHYA KARCH MRN: [id number] Date of Birth: [birth year]  Subjective/Objective:         Patient was admitted for a brain mass with plans for possible surgery.  CM will follow for discharge needs pending patient's progress and physician orders.            Action/Plan:   Expected Discharge Date:  08/01/16               Expected Discharge Plan:     In-House Referral:     Discharge planning Services     Post Acute Care Choice:    Choice offered to:     DME Arranged:    DME Agency:     HH Arranged:    HH Agency:     Status of Service:     If discussed at [employer], dates discussed:    Additional Comments:  Rolm Baptise, RN 08/01/2016, 3:39 PM

## 2016-08-02 ENCOUNTER — Encounter (HOSPITAL_COMMUNITY)
Admission: EM | Disposition: A | Discharge status: Home / Self Care | Payer: Self-pay | Source: Home / Self Care | Attending: Internal Medicine

## 2016-08-02 LAB — TYPE AND SCREEN
ABO/RH(D): O POS
ANTIBODY SCREEN: NEGATIVE

## 2016-08-02 LAB — GLUCOSE, CAPILLARY: GLUCOSE-CAPILLARY: 138 mg/dL — AB (ref 65–99)

## 2016-08-02 LAB — ABO/RH: ABO/RH(D): O POS

## 2016-08-02 SURGERY — CRANIOTOMY TUMOR EXCISION
Anesthesia: General | Laterality: Right

## 2016-08-02 MED ORDER — DEXAMETHASONE 4 MG PO TABS: 4.0000 mg | ORAL_TABLET | Freq: Four times a day (QID) | ORAL | 0 refills | Status: AC

## 2016-08-02 MED ORDER — DEXAMETHASONE 4 MG PO TABS: 4.0000 mg | ORAL_TABLET | Freq: Four times a day (QID) | ORAL | Status: DC

## 2016-08-02 MED ORDER — ACETAMINOPHEN 325 MG PO TABS: 650.0000 mg | ORAL_TABLET | Freq: Four times a day (QID) | ORAL | 0 refills | Status: AC | PRN

## 2016-08-02 MED ORDER — FAMOTIDINE 10 MG PO TABS: 10.0000 mg | ORAL_TABLET | Freq: Two times a day (BID) | ORAL | 0 refills | Status: AC

## 2016-08-02 MED ORDER — BLOOD GLUCOSE METER KIT: PACK | 0 refills | Status: AC

## 2016-08-02 NOTE — Progress Notes (Signed)
Patient stated she is declining surgery today. MD made aware.

## 2016-08-02 NOTE — Progress Notes (Signed)
Patient is discharged from room 5M04 at this time. Alert and in stable condition. IV site d/c'd and instructions read to patient with understanding verbalized. Patient requested medical records be released for second opinion. Form signed by patient and faxed to medical records as office was closed at the time. Advised patient to call tomorrow to verify receipt. Left unit via wheelchair with all belongings and family at side.

## 2016-08-02 NOTE — Progress Notes (Signed)
Pt seen and examined. No issues overnight.  Patient still trying to decide about surgery today or being discharged to seek a second opinion.  EXAM: Temp:  [97.8 F (36.6 C)-98.7 F (37.1 C)] 98.2 F (36.8 C) (02/13 0452) Pulse Rate:  [64-80] 64 (02/13 0452) Resp:  [20] 20 (02/13 0452) BP: (120-158)/(74-98) 120/83 (02/13 0452) SpO2:  [96 %-100 %] 100 % (02/13 0452) Intake/Output      02/12 0701 - 02/13 0700 02/13 0701 - 02/14 0700   P.O. 400    I.V. (mL/kg) 433.8 (6.4)    Total Intake(mL/kg) 833.8 (12.3)    Net +833.8          Urine Occurrence 3 x     Awake and alert Follows commands throughout Full strength  Stable Keep NPO pending decision this afternoon about surgery

## 2016-08-02 NOTE — Progress Notes (Signed)
OT Cancellation    08/02/16 0700  OT Visit Information  Last OT Received On 08/02/16  Reason Eval/Treat Not Completed Patient at procedure or test/ unavailable  [facility], OT/L  [id number] 08/02/2016

## 2016-08-02 NOTE — Progress Notes (Signed)
Patient refused CHG bath tonight.Patient stated," I'm still not sure if I want the surgery or not.Maybe I need to get second  opinion."

## 2016-08-02 NOTE — Discharge Summary (Signed)
Physician Discharge Summary  NANSI BIRMINGHAM OQH:[id number] DOB: 03/27/1946 DOA: 07/29/2016  PCP: Anthoney Harada, MD  Admit date: 07/29/2016 Discharge date: 08/02/2016  Admitted From: Home  Disposition:  Home   Recommendations for Outpatient Follow-up:  1. Follow up with PCP in 1-2 weeks 2. Please obtain BMP/CBC in one week 3. Monitor CBG and BP , patient now on decadron.  4. Follow up at [facility] for second opinion.     Discharge Condition: Stable.  CODE STATUS: Full code.  Diet recommendation: Heart Healthy   Brief/Interim Summary: 71 year old female presented with several weeks of intermittent headache and left-sided weakness and numbness and tingling sensation. MRI of the brain showed brain mass.  Assessment & Plan:  Right sided brain mass:  -Clinically stable today. Neurosurgery consulted. patient has decline surgery. She would like second opinion. Dr. Marnette Burgess and faxed information. Discussed with Dr Cyndy Freeze , patinet stable to be discharge. Discharge patient on decadron 4 mg Q 6 hours.  -OT outpatient  -CT scan of chest abdomen and pelvis with no evidence of malignancy. -As per prior progress note, my colleague discussed with Dr.Shadad from oncologist will probably consult when tissue diagnosis is available.  Hypothyroidism: Continue Synthroid.   Discharge Diagnoses:  Active Problems:   GERD   Brain mass    Discharge Instructions  Discharge Instructions    Diet - low sodium heart healthy    Complete by:  As directed    Increase activity slowly    Complete by:  As directed      Allergies as of 08/02/2016      Reactions   No Known Allergies       Medication List    STOP taking these medications   VITAMIN K2-VITAMIN D3 PO     TAKE these medications   acetaminophen 325 MG tablet Commonly known as:  TYLENOL Take 2 tablets (650 mg total) by mouth every 6 (six) hours as needed for mild pain (or Fever >/= 101).   blood glucose meter kit  and supplies Dispense based on patient and insurance preference. Use up to four times daily as directed. (FOR ICD-9 250.00, 250.01).   clonazePAM 1 MG tablet Commonly known as:  KLONOPIN Take 0.5 mg by mouth at bedtime.   Cyanocobalamin 200 MCG/SPRAY Liqd Take 1,000 mcg by mouth daily.   dexamethasone 4 MG tablet Commonly known as:  DECADRON Take 1 tablet (4 mg total) by mouth every 6 (six) hours.   famotidine 10 MG tablet Commonly known as:  PEPCID Take 1 tablet (10 mg total) by mouth 2 (two) times daily.   hydrOXYzine 25 MG capsule Commonly known as:  VISTARIL Take 25 mg by mouth every 6 (six) hours as needed for itching.   lamoTRIgine 100 MG tablet Commonly known as:  LAMICTAL Take 100 mg by mouth daily.   multivitamin tablet Take 1 tablet by mouth daily.   NATURE-THROID 65 MG tablet Generic drug:  thyroid Take 65 mg by mouth daily.   nortriptyline 10 MG capsule Commonly known as:  PAMELOR Take 20 mg by mouth at bedtime.   VIIBRYD 40 MG Tabs Generic drug:  Vilazodone HCl Take 40 mg by mouth daily.      Follow-up Information    WONG,FRANCIS PATRICK, MD Follow up in 3 day(s).   Specialty:  Family Medicine Contact information: [place] [address] [phone]          Allergies  Allergen Reactions  . No Known Allergies  Consultations:  Neurosurgery    Procedures/Studies: Ct Head Wo Contrast  Result Date: 07/31/2016 CLINICAL DATA:  Continued surveillance and preoperative planning RIGHT hemisphere lesion. EXAM: CT HEAD WITHOUT CONTRAST TECHNIQUE: Contiguous axial images were obtained from the base of the skull through the vertex without intravenous contrast. COMPARISON:  07/30/2016. FINDINGS: Brain: 1 mm thick images were obtained through the large RIGHT hemisphere. 4 cm Cystic and solid lesion is redemonstrated, without significant change in size or hemorrhagic transformation. The edema pattern has not improved significantly  following steroid administration. Vascular: No hyperdense vessel or unexpected calcification. Skull: Normal. Negative for fracture or focal lesion. Sinuses/Orbits: No acute finding. Other: None. IMPRESSION: Brain lab protocol demonstrating cystic and solid RIGHT hemisphere lesion with surrounding edema, stable. Electronically Signed   By: Staci Righter M.D.   On: 07/31/2016 13:46   Ct Head Wo Contrast  Result Date: 07/29/2016 CLINICAL DATA:  Sudden onset of dizziness and headache with left arm numbness EXAM: CT HEAD WITHOUT CONTRAST TECHNIQUE: Contiguous axial images were obtained from the base of the skull through the vertex without intravenous contrast. COMPARISON:  None. FINDINGS: Brain: In the right parietal lobe there is a 4.8 x 3.9 cm predominately cystic lesion with evidence of a a more high density mural nodule identified which measures 2.4 cm in greatest dimension. Significant surrounding white matter edema is noted. Midline shift is seen of approximately 4 mm from right to left. Vascular: No hyperdense vessel or unexpected calcification. Skull: Normal. Negative for fracture or focal lesion. Sinuses/Orbits: No acute finding. Other: None. 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 Further evaluation by means of MRI is recommended Electronically Signed   By: Inez Catalina M.D.   On: 07/29/2016 19:54   Ct Chest W Contrast  Result Date: 07/31/2016 CLINICAL DATA:  Brain tumor.  Primary lesion unknown EXAM: CT CHEST, ABDOMEN, AND PELVIS WITH CONTRAST TECHNIQUE: Multidetector CT imaging of the chest, abdomen and pelvis was performed following the standard protocol during bolus administration of intravenous contrast. CONTRAST:  180m ISOVUE-300 IOPAMIDOL (ISOVUE-300) INJECTION 61% COMPARISON:  Brain MRI 07/30/2016 FINDINGS: CT CHEST FINDINGS Cardiovascular: Coronary artery calcification and aortic atherosclerotic calcification.  Mediastinum/Nodes: There is calcified mediastinal lymph nodes. No axillary supraclavicular adenopathy. No pericardial fluid. Esophagus normal. Lungs/Pleura: No suspicious pulmonary nodules. No pulmonary mass. No endobronchial lesion. Musculoskeletal: No aggressive osseous lesion. CT ABDOMEN AND PELVIS FINDINGS Hepatobiliary: No focal hepatic lesion. Gallbladder is collapsed and. Pancreas: Pancreas is normal. No ductal dilatation. No pancreatic inflammation. Spleen: Normal spleen Adrenals/urinary tract: Adrenal glands and kidneys are normal. The ureters and bladder normal. Stomach/Bowel: Stomach, small bowel, appendix, and cecum are normal. The colon and rectosigmoid colon are normal. Vascular/Lymphatic: Abdominal aorta is normal caliber with atherosclerotic calcification. There is no retroperitoneal or periportal lymphadenopathy. No pelvic lymphadenopathy. Reproductive: Uterus and ovaries are normal Other: No peritoneal metastasis. Musculoskeletal: No aggressive osseous lesion. IMPRESSION: Chest Impression: 1. No thoracic malignancy. 2. Calcified mediastinal lymph node suggests granulomatous disease. 3. Coronary artery calcification and aortic atherosclerotic calcification. Abdomen / Pelvis Impression: 1. No evidence malignancy in the abdomen pelvis. 2.  Atherosclerotic calcification of the aorta. Electronically Signed   By: SSuzy BouchardM.D.   On: 07/31/2016 13:20   Mr BJeri CosWUYContrast  Result Date: 07/30/2016 CLINICAL DATA:  LEFT-sided weakness. Symptoms for several days to weeks. EXAM: MRI HEAD WITHOUT AND WITH CONTRAST TECHNIQUE: Multiplanar, multiecho pulse sequences of the brain and surrounding structures were obtained  without and with intravenous contrast. CONTRAST:  80m MULTIHANCE GADOBENATE DIMEGLUMINE 529 MG/ML IV SOLN COMPARISON:  CT head 07/29/2016. FINDINGS: Brain: Large cystic and solid intra-axial RIGHT hemisphere lesion, marked surrounding edema. Approximate cross-sectional measurements  of 38 x 40 x 39 mm (R-L x A-P x C-C) . Largest solid component is superomedial and has mild restriction on DWI. It is difficult to determine if the lesion is in the posterior frontal lobe, parietal lobe, or straddling the two. RIGHT-to-LEFT shift of 4 mm. Within limits for assessment due to motion degradation, the lesion appears solitary. No visible hemorrhage or susceptibility. Avid postcontrast enhancement. Mild atrophy. Chronic microvascular ischemic change of a mild nature. Vascular: Flow voids are maintained throughout the carotid, basilar, and vertebral arteries. There are no areas of chronic hemorrhage. Skull and upper cervical spine: Normal marrow signal. Sinuses/Orbits: Negative. Other: None. 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 Primary brain tumor and/or abscess less favored. Consider metastatic workup. See discussion above. Electronically Signed   By: JStaci RighterM.D.   On: 07/30/2016 15:11   Ct Abdomen Pelvis W Contrast  Result Date: 07/31/2016 CLINICAL DATA:  Brain tumor.  Primary lesion unknown EXAM: CT CHEST, ABDOMEN, AND PELVIS WITH CONTRAST TECHNIQUE: Multidetector CT imaging of the chest, abdomen and pelvis was performed following the standard protocol during bolus administration of intravenous contrast. CONTRAST:  1035mISOVUE-300 IOPAMIDOL (ISOVUE-300) INJECTION 61% COMPARISON:  Brain MRI 07/30/2016 FINDINGS: CT CHEST FINDINGS Cardiovascular: Coronary artery calcification and aortic atherosclerotic calcification. Mediastinum/Nodes: There is calcified mediastinal lymph nodes. No axillary supraclavicular adenopathy. No pericardial fluid. Esophagus normal. Lungs/Pleura: No suspicious pulmonary nodules. No pulmonary mass. No endobronchial lesion. Musculoskeletal: No aggressive osseous lesion. CT ABDOMEN AND PELVIS FINDINGS Hepatobiliary: No focal hepatic lesion. Gallbladder is collapsed and. Pancreas: Pancreas is normal. No  ductal dilatation. No pancreatic inflammation. Spleen: Normal spleen Adrenals/urinary tract: Adrenal glands and kidneys are normal. The ureters and bladder normal. Stomach/Bowel: Stomach, small bowel, appendix, and cecum are normal. The colon and rectosigmoid colon are normal. Vascular/Lymphatic: Abdominal aorta is normal caliber with atherosclerotic calcification. There is no retroperitoneal or periportal lymphadenopathy. No pelvic lymphadenopathy. Reproductive: Uterus and ovaries are normal Other: No peritoneal metastasis. Musculoskeletal: No aggressive osseous lesion. IMPRESSION: Chest Impression: 1. No thoracic malignancy. 2. Calcified mediastinal lymph node suggests granulomatous disease. 3. Coronary artery calcification and aortic atherosclerotic calcification. Abdomen / Pelvis Impression: 1. No evidence malignancy in the abdomen pelvis. 2.  Atherosclerotic calcification of the aorta. Electronically Signed   By: StSuzy Bouchard.D.   On: 07/31/2016 13:20     Subjective: She does not want to have surgery. She would like second opinion. She will follow at [facility].    Discharge Exam: Vitals:   08/02/16 0938 08/02/16 1359  BP: 132/86 (!) 159/76  Pulse: 70 70  Resp: 20 20  Temp: 98.7 F (37.1 C) 98.3 F (36.8 C)   Vitals:   08/02/16 0100 08/02/16 0452 08/02/16 0938 08/02/16 1359  BP: (!) 158/98 120/83 132/86 (!) 159/76  Pulse: 73 64 70 70  Resp: '20 20 20 20  ' Temp: 97.9 F (36.6 C) 98.2 F (36.8 C) 98.7 F (37.1 C) 98.3 F (36.8 C)  TempSrc: Oral Oral Oral Oral  SpO2: 96% 100% 98% 100%  Weight:      Height:        General: Pt is alert, awake, not in acute distress Cardiovascular: RRR, S1/S2 +, no rubs, no gallops Respiratory: CTA bilaterally, no  wheezing, no rhonchi Abdominal: Soft, NT, ND, bowel sounds + Extremities: no edema, no cyanosis    The results of significant diagnostics from this hospitalization (including imaging, microbiology, ancillary and laboratory) are  listed below for reference.     Microbiology: No results found for this or any previous visit (from the past 240 hour(s)).   Labs: BNP (last 3 results) No results for input(s): BNP in the last 8760 hours. Basic Metabolic Panel:  Recent Labs Lab 07/29/16 1928 07/30/16 0546  NA 137 139  K 3.6 3.7  CL 107 103  CO2 21* 23  GLUCOSE 88 159*  BUN 8 7  CREATININE 0.73 0.62  CALCIUM 9.3 9.6  MG  --  1.9  PHOS  --  3.1   Liver Function Tests:  Recent Labs Lab 07/30/16 0546  AST 28  ALT 24  ALKPHOS 80  BILITOT 0.9  PROT 6.5  ALBUMIN 4.0   No results for input(s): LIPASE, AMYLASE in the last 168 hours. No results for input(s): AMMONIA in the last 168 hours. CBC:  Recent Labs Lab 07/29/16 1928 07/30/16 0546  WBC 7.9 6.1  HGB 13.0 13.6  HCT 37.6 39.1  MCV 93.3 94.0  PLT 268 272   Cardiac Enzymes: No results for input(s): CKTOTAL, CKMB, CKMBINDEX, TROPONINI in the last 168 hours. BNP: Invalid input(s): POCBNP CBG:  Recent Labs Lab 08/01/16 2003 08/02/16 0822  GLUCAP 119* 138*   D-Dimer No results for input(s): DDIMER in the last 72 hours. Hgb A1c No results for input(s): HGBA1C in the last 72 hours. Lipid Profile No results for input(s): CHOL, HDL, LDLCALC, TRIG, CHOLHDL, LDLDIRECT in the last 72 hours. Thyroid function studies No results for input(s): TSH, T4TOTAL, T3FREE, THYROIDAB in the last 72 hours.  Invalid input(s): FREET3 Anemia work up No results for input(s): VITAMINB12, FOLATE, FERRITIN, TIBC, IRON, RETICCTPCT in the last 72 hours. Urinalysis    Component Value Date/Time   COLORURINE YELLOW 07/29/2016 0021   APPEARANCEUR CLEAR 07/29/2016 0021   LABSPEC 1.006 07/29/2016 0021   PHURINE 6.0 07/29/2016 0021   GLUCOSEU NEGATIVE 07/29/2016 0021   HGBUR NEGATIVE 07/29/2016 0021   BILIRUBINUR NEGATIVE 07/29/2016 0021   KETONESUR NEGATIVE 07/29/2016 0021   PROTEINUR NEGATIVE 07/29/2016 0021   NITRITE NEGATIVE 07/29/2016 0021   LEUKOCYTESUR  NEGATIVE 07/29/2016 0021   Sepsis Labs Invalid input(s): PROCALCITONIN,  WBC,  LACTICIDVEN Microbiology No results found for this or any previous visit (from the past 240 hour(s)).   Time coordinating discharge: Over 30 minutes  SIGNED:   Elmarie Shiley, MD  [facility] 08/02/2016, 2:48 PM Pager   If 7PM-7AM, please contact night-coverage [email or web address] Password [email or web address]

## 2016-08-02 NOTE — Progress Notes (Signed)
Patient wishes to go to [facility] for a second opinion.  She will have her records faxed there.

## 2016-08-02 NOTE — Care Management Note (Signed)
Case Management Note  Patient Details  Name: Kristina Anthony MRN: [id number] Date of Birth: [birth year]  Subjective/Objective:                    Action/Plan: Pt discharging home with self care. CM consulted for outpatient therapy. Patient asked to go to outpatient therapy at [facility]. Orders placed in EPIC and information on the AVS. Pt has transportation home.   Expected Discharge Date:  08/02/16               Expected Discharge Plan:  Home/Self Care  In-House Referral:     Discharge planning Services  CM Consult  Post Acute Care Choice:    Choice offered to:     DME Arranged:    DME Agency:     HH Arranged:    HH Agency:     Status of Service:  Completed, signed off  If discussed at [employer] of Stay Meetings, dates discussed:    Additional Comments:  Pollie Friar, RN 08/02/2016, 4:29 PM

## 2016-08-05 DIAGNOSIS — D496 Neoplasm of unspecified behavior of brain: Secondary | ICD-10-CM | POA: Diagnosis not present

## 2016-08-05 DIAGNOSIS — Z01818 Encounter for other preprocedural examination: Secondary | ICD-10-CM | POA: Diagnosis not present

## 2016-08-08 DIAGNOSIS — Z87891 Personal history of nicotine dependence: Secondary | ICD-10-CM | POA: Diagnosis not present

## 2016-08-08 DIAGNOSIS — D496 Neoplasm of unspecified behavior of brain: Secondary | ICD-10-CM | POA: Diagnosis not present

## 2016-08-08 DIAGNOSIS — M1990 Unspecified osteoarthritis, unspecified site: Secondary | ICD-10-CM | POA: Diagnosis present

## 2016-08-08 DIAGNOSIS — I2510 Atherosclerotic heart disease of native coronary artery without angina pectoris: Secondary | ICD-10-CM | POA: Diagnosis present

## 2016-08-08 DIAGNOSIS — G936 Cerebral edema: Secondary | ICD-10-CM | POA: Diagnosis present

## 2016-08-08 DIAGNOSIS — F1011 Alcohol abuse, in remission: Secondary | ICD-10-CM | POA: Diagnosis not present

## 2016-08-08 DIAGNOSIS — G9389 Other specified disorders of brain: Secondary | ICD-10-CM | POA: Diagnosis not present

## 2016-08-08 DIAGNOSIS — E039 Hypothyroidism, unspecified: Secondary | ICD-10-CM | POA: Diagnosis not present

## 2016-08-08 DIAGNOSIS — F329 Major depressive disorder, single episode, unspecified: Secondary | ICD-10-CM | POA: Diagnosis not present

## 2016-08-08 DIAGNOSIS — F418 Other specified anxiety disorders: Secondary | ICD-10-CM | POA: Diagnosis present

## 2016-08-08 DIAGNOSIS — K219 Gastro-esophageal reflux disease without esophagitis: Secondary | ICD-10-CM | POA: Diagnosis not present

## 2016-08-08 DIAGNOSIS — F419 Anxiety disorder, unspecified: Secondary | ICD-10-CM | POA: Diagnosis not present

## 2016-08-08 DIAGNOSIS — G8194 Hemiplegia, unspecified affecting left nondominant side: Secondary | ICD-10-CM | POA: Diagnosis present

## 2016-08-08 DIAGNOSIS — C711 Malignant neoplasm of frontal lobe: Secondary | ICD-10-CM | POA: Diagnosis not present

## 2016-08-08 DIAGNOSIS — I82409 Acute embolism and thrombosis of unspecified deep veins of unspecified lower extremity: Secondary | ICD-10-CM | POA: Diagnosis not present

## 2016-08-08 DIAGNOSIS — C718 Malignant neoplasm of overlapping sites of brain: Secondary | ICD-10-CM | POA: Diagnosis not present

## 2016-08-18 DIAGNOSIS — I2510 Atherosclerotic heart disease of native coronary artery without angina pectoris: Secondary | ICD-10-CM | POA: Diagnosis not present

## 2016-08-18 DIAGNOSIS — Z79899 Other long term (current) drug therapy: Secondary | ICD-10-CM | POA: Diagnosis not present

## 2016-08-18 DIAGNOSIS — F329 Major depressive disorder, single episode, unspecified: Secondary | ICD-10-CM | POA: Diagnosis not present

## 2016-08-18 DIAGNOSIS — F419 Anxiety disorder, unspecified: Secondary | ICD-10-CM | POA: Diagnosis not present

## 2016-08-18 DIAGNOSIS — E039 Hypothyroidism, unspecified: Secondary | ICD-10-CM | POA: Diagnosis not present

## 2016-08-18 DIAGNOSIS — Z87891 Personal history of nicotine dependence: Secondary | ICD-10-CM | POA: Diagnosis not present

## 2016-08-18 DIAGNOSIS — Z9889 Other specified postprocedural states: Secondary | ICD-10-CM | POA: Diagnosis not present

## 2016-08-18 DIAGNOSIS — C719 Malignant neoplasm of brain, unspecified: Secondary | ICD-10-CM | POA: Diagnosis not present

## 2016-08-18 DIAGNOSIS — D496 Neoplasm of unspecified behavior of brain: Secondary | ICD-10-CM | POA: Diagnosis not present

## 2016-08-23 DIAGNOSIS — F5101 Primary insomnia: Secondary | ICD-10-CM | POA: Diagnosis not present

## 2016-08-23 DIAGNOSIS — R300 Dysuria: Secondary | ICD-10-CM | POA: Diagnosis not present

## 2016-08-23 DIAGNOSIS — Z4802 Encounter for removal of sutures: Secondary | ICD-10-CM | POA: Diagnosis not present

## 2016-08-24 DIAGNOSIS — Z510 Encounter for antineoplastic radiation therapy: Secondary | ICD-10-CM | POA: Diagnosis not present

## 2016-08-24 DIAGNOSIS — C719 Malignant neoplasm of brain, unspecified: Secondary | ICD-10-CM | POA: Diagnosis not present

## 2016-08-24 DIAGNOSIS — Z006 Encounter for examination for normal comparison and control in clinical research program: Secondary | ICD-10-CM | POA: Diagnosis not present

## 2016-08-24 DIAGNOSIS — C711 Malignant neoplasm of frontal lobe: Secondary | ICD-10-CM | POA: Diagnosis not present

## 2016-08-24 DIAGNOSIS — Z79899 Other long term (current) drug therapy: Secondary | ICD-10-CM | POA: Diagnosis not present

## 2016-08-24 DIAGNOSIS — Z5181 Encounter for therapeutic drug level monitoring: Secondary | ICD-10-CM | POA: Diagnosis not present

## 2016-08-31 DIAGNOSIS — R2241 Localized swelling, mass and lump, right lower limb: Secondary | ICD-10-CM | POA: Diagnosis not present

## 2016-08-31 DIAGNOSIS — C719 Malignant neoplasm of brain, unspecified: Secondary | ICD-10-CM | POA: Diagnosis not present

## 2016-08-31 DIAGNOSIS — F329 Major depressive disorder, single episode, unspecified: Secondary | ICD-10-CM | POA: Diagnosis not present

## 2016-08-31 DIAGNOSIS — M7989 Other specified soft tissue disorders: Secondary | ICD-10-CM | POA: Diagnosis not present

## 2016-08-31 DIAGNOSIS — M25471 Effusion, right ankle: Secondary | ICD-10-CM | POA: Diagnosis not present

## 2016-09-06 DIAGNOSIS — Z8639 Personal history of other endocrine, nutritional and metabolic disease: Secondary | ICD-10-CM | POA: Diagnosis not present

## 2016-09-06 DIAGNOSIS — Z79899 Other long term (current) drug therapy: Secondary | ICD-10-CM | POA: Diagnosis not present

## 2016-09-06 DIAGNOSIS — Z5181 Encounter for therapeutic drug level monitoring: Secondary | ICD-10-CM | POA: Diagnosis not present

## 2016-09-06 DIAGNOSIS — Z510 Encounter for antineoplastic radiation therapy: Secondary | ICD-10-CM | POA: Diagnosis not present

## 2016-09-06 DIAGNOSIS — K219 Gastro-esophageal reflux disease without esophagitis: Secondary | ICD-10-CM | POA: Diagnosis not present

## 2016-09-06 DIAGNOSIS — C719 Malignant neoplasm of brain, unspecified: Secondary | ICD-10-CM | POA: Diagnosis not present

## 2016-09-06 DIAGNOSIS — C711 Malignant neoplasm of frontal lobe: Secondary | ICD-10-CM | POA: Diagnosis not present

## 2016-09-06 DIAGNOSIS — Z006 Encounter for examination for normal comparison and control in clinical research program: Secondary | ICD-10-CM | POA: Diagnosis not present

## 2016-09-06 DIAGNOSIS — Z79891 Long term (current) use of opiate analgesic: Secondary | ICD-10-CM | POA: Diagnosis not present

## 2016-09-06 DIAGNOSIS — F329 Major depressive disorder, single episode, unspecified: Secondary | ICD-10-CM | POA: Diagnosis not present

## 2016-09-07 DIAGNOSIS — C719 Malignant neoplasm of brain, unspecified: Secondary | ICD-10-CM | POA: Diagnosis not present

## 2016-09-13 DIAGNOSIS — C719 Malignant neoplasm of brain, unspecified: Secondary | ICD-10-CM | POA: Diagnosis not present

## 2016-09-14 DIAGNOSIS — C719 Malignant neoplasm of brain, unspecified: Secondary | ICD-10-CM | POA: Diagnosis not present

## 2016-09-14 DIAGNOSIS — Z006 Encounter for examination for normal comparison and control in clinical research program: Secondary | ICD-10-CM | POA: Diagnosis not present

## 2016-09-14 DIAGNOSIS — Z510 Encounter for antineoplastic radiation therapy: Secondary | ICD-10-CM | POA: Diagnosis not present

## 2016-09-14 DIAGNOSIS — Z79899 Other long term (current) drug therapy: Secondary | ICD-10-CM | POA: Diagnosis not present

## 2016-09-14 DIAGNOSIS — C711 Malignant neoplasm of frontal lobe: Secondary | ICD-10-CM | POA: Diagnosis not present

## 2016-09-14 DIAGNOSIS — Z5181 Encounter for therapeutic drug level monitoring: Secondary | ICD-10-CM | POA: Diagnosis not present

## 2016-09-22 DIAGNOSIS — C718 Malignant neoplasm of overlapping sites of brain: Secondary | ICD-10-CM | POA: Diagnosis not present

## 2016-09-22 DIAGNOSIS — C719 Malignant neoplasm of brain, unspecified: Secondary | ICD-10-CM | POA: Diagnosis not present

## 2016-09-22 DIAGNOSIS — Z9889 Other specified postprocedural states: Secondary | ICD-10-CM | POA: Diagnosis not present

## 2016-09-23 DIAGNOSIS — C719 Malignant neoplasm of brain, unspecified: Secondary | ICD-10-CM | POA: Diagnosis not present

## 2016-09-23 DIAGNOSIS — Z510 Encounter for antineoplastic radiation therapy: Secondary | ICD-10-CM | POA: Diagnosis not present

## 2016-09-23 DIAGNOSIS — Z5181 Encounter for therapeutic drug level monitoring: Secondary | ICD-10-CM | POA: Diagnosis not present

## 2016-09-23 DIAGNOSIS — C711 Malignant neoplasm of frontal lobe: Secondary | ICD-10-CM | POA: Diagnosis not present

## 2016-09-23 DIAGNOSIS — Z79899 Other long term (current) drug therapy: Secondary | ICD-10-CM | POA: Diagnosis not present

## 2016-09-26 DIAGNOSIS — Z5181 Encounter for therapeutic drug level monitoring: Secondary | ICD-10-CM | POA: Diagnosis not present

## 2016-09-26 DIAGNOSIS — C719 Malignant neoplasm of brain, unspecified: Secondary | ICD-10-CM | POA: Diagnosis not present

## 2016-09-26 DIAGNOSIS — Z510 Encounter for antineoplastic radiation therapy: Secondary | ICD-10-CM | POA: Diagnosis not present

## 2016-09-26 DIAGNOSIS — C711 Malignant neoplasm of frontal lobe: Secondary | ICD-10-CM | POA: Diagnosis not present

## 2016-09-26 DIAGNOSIS — Z79899 Other long term (current) drug therapy: Secondary | ICD-10-CM | POA: Diagnosis not present

## 2016-09-27 DIAGNOSIS — Z79899 Other long term (current) drug therapy: Secondary | ICD-10-CM | POA: Diagnosis not present

## 2016-09-27 DIAGNOSIS — Z510 Encounter for antineoplastic radiation therapy: Secondary | ICD-10-CM | POA: Diagnosis not present

## 2016-09-27 DIAGNOSIS — C711 Malignant neoplasm of frontal lobe: Secondary | ICD-10-CM | POA: Diagnosis not present

## 2016-09-27 DIAGNOSIS — C719 Malignant neoplasm of brain, unspecified: Secondary | ICD-10-CM | POA: Diagnosis not present

## 2016-09-27 DIAGNOSIS — Z5181 Encounter for therapeutic drug level monitoring: Secondary | ICD-10-CM | POA: Diagnosis not present

## 2016-09-28 DIAGNOSIS — Z5181 Encounter for therapeutic drug level monitoring: Secondary | ICD-10-CM | POA: Diagnosis not present

## 2016-09-28 DIAGNOSIS — C711 Malignant neoplasm of frontal lobe: Secondary | ICD-10-CM | POA: Diagnosis not present

## 2016-09-28 DIAGNOSIS — Z510 Encounter for antineoplastic radiation therapy: Secondary | ICD-10-CM | POA: Diagnosis not present

## 2016-09-28 DIAGNOSIS — Z79899 Other long term (current) drug therapy: Secondary | ICD-10-CM | POA: Diagnosis not present

## 2016-09-28 DIAGNOSIS — C719 Malignant neoplasm of brain, unspecified: Secondary | ICD-10-CM | POA: Diagnosis not present

## 2016-09-29 DIAGNOSIS — Z5181 Encounter for therapeutic drug level monitoring: Secondary | ICD-10-CM | POA: Diagnosis not present

## 2016-09-29 DIAGNOSIS — C711 Malignant neoplasm of frontal lobe: Secondary | ICD-10-CM | POA: Diagnosis not present

## 2016-09-29 DIAGNOSIS — C719 Malignant neoplasm of brain, unspecified: Secondary | ICD-10-CM | POA: Diagnosis not present

## 2016-09-29 DIAGNOSIS — Z79899 Other long term (current) drug therapy: Secondary | ICD-10-CM | POA: Diagnosis not present

## 2016-09-29 DIAGNOSIS — Z510 Encounter for antineoplastic radiation therapy: Secondary | ICD-10-CM | POA: Diagnosis not present

## 2016-10-03 DIAGNOSIS — C711 Malignant neoplasm of frontal lobe: Secondary | ICD-10-CM | POA: Diagnosis not present

## 2016-10-03 DIAGNOSIS — Z5181 Encounter for therapeutic drug level monitoring: Secondary | ICD-10-CM | POA: Diagnosis not present

## 2016-10-03 DIAGNOSIS — C719 Malignant neoplasm of brain, unspecified: Secondary | ICD-10-CM | POA: Diagnosis not present

## 2016-10-03 DIAGNOSIS — Z79899 Other long term (current) drug therapy: Secondary | ICD-10-CM | POA: Diagnosis not present

## 2016-10-03 DIAGNOSIS — Z510 Encounter for antineoplastic radiation therapy: Secondary | ICD-10-CM | POA: Diagnosis not present

## 2016-10-04 DIAGNOSIS — Z510 Encounter for antineoplastic radiation therapy: Secondary | ICD-10-CM | POA: Diagnosis not present

## 2016-10-04 DIAGNOSIS — C711 Malignant neoplasm of frontal lobe: Secondary | ICD-10-CM | POA: Diagnosis not present

## 2016-10-04 DIAGNOSIS — Z5181 Encounter for therapeutic drug level monitoring: Secondary | ICD-10-CM | POA: Diagnosis not present

## 2016-10-04 DIAGNOSIS — Z79899 Other long term (current) drug therapy: Secondary | ICD-10-CM | POA: Diagnosis not present

## 2016-10-04 DIAGNOSIS — C719 Malignant neoplasm of brain, unspecified: Secondary | ICD-10-CM | POA: Diagnosis not present

## 2016-10-05 DIAGNOSIS — C711 Malignant neoplasm of frontal lobe: Secondary | ICD-10-CM | POA: Diagnosis not present

## 2016-10-05 DIAGNOSIS — Z5181 Encounter for therapeutic drug level monitoring: Secondary | ICD-10-CM | POA: Diagnosis not present

## 2016-10-05 DIAGNOSIS — C719 Malignant neoplasm of brain, unspecified: Secondary | ICD-10-CM | POA: Diagnosis not present

## 2016-10-05 DIAGNOSIS — Z79899 Other long term (current) drug therapy: Secondary | ICD-10-CM | POA: Diagnosis not present

## 2016-10-05 DIAGNOSIS — Z510 Encounter for antineoplastic radiation therapy: Secondary | ICD-10-CM | POA: Diagnosis not present

## 2016-10-06 DIAGNOSIS — Z510 Encounter for antineoplastic radiation therapy: Secondary | ICD-10-CM | POA: Diagnosis not present

## 2016-10-06 DIAGNOSIS — F4323 Adjustment disorder with mixed anxiety and depressed mood: Secondary | ICD-10-CM | POA: Diagnosis not present

## 2016-10-06 DIAGNOSIS — C719 Malignant neoplasm of brain, unspecified: Secondary | ICD-10-CM | POA: Diagnosis not present

## 2016-10-06 DIAGNOSIS — C711 Malignant neoplasm of frontal lobe: Secondary | ICD-10-CM | POA: Diagnosis not present

## 2016-10-06 DIAGNOSIS — Z5181 Encounter for therapeutic drug level monitoring: Secondary | ICD-10-CM | POA: Diagnosis not present

## 2016-10-06 DIAGNOSIS — Z79899 Other long term (current) drug therapy: Secondary | ICD-10-CM | POA: Diagnosis not present

## 2016-10-07 DIAGNOSIS — Z5181 Encounter for therapeutic drug level monitoring: Secondary | ICD-10-CM | POA: Diagnosis not present

## 2016-10-07 DIAGNOSIS — C719 Malignant neoplasm of brain, unspecified: Secondary | ICD-10-CM | POA: Diagnosis not present

## 2016-10-07 DIAGNOSIS — Z510 Encounter for antineoplastic radiation therapy: Secondary | ICD-10-CM | POA: Diagnosis not present

## 2016-10-07 DIAGNOSIS — Z79899 Other long term (current) drug therapy: Secondary | ICD-10-CM | POA: Diagnosis not present

## 2016-10-07 DIAGNOSIS — C711 Malignant neoplasm of frontal lobe: Secondary | ICD-10-CM | POA: Diagnosis not present

## 2016-10-10 DIAGNOSIS — Z5181 Encounter for therapeutic drug level monitoring: Secondary | ICD-10-CM | POA: Diagnosis not present

## 2016-10-10 DIAGNOSIS — Z79899 Other long term (current) drug therapy: Secondary | ICD-10-CM | POA: Diagnosis not present

## 2016-10-10 DIAGNOSIS — Z510 Encounter for antineoplastic radiation therapy: Secondary | ICD-10-CM | POA: Diagnosis not present

## 2016-10-10 DIAGNOSIS — C711 Malignant neoplasm of frontal lobe: Secondary | ICD-10-CM | POA: Diagnosis not present

## 2016-10-11 DIAGNOSIS — Z5181 Encounter for therapeutic drug level monitoring: Secondary | ICD-10-CM | POA: Diagnosis not present

## 2016-10-11 DIAGNOSIS — Z510 Encounter for antineoplastic radiation therapy: Secondary | ICD-10-CM | POA: Diagnosis not present

## 2016-10-11 DIAGNOSIS — Z79899 Other long term (current) drug therapy: Secondary | ICD-10-CM | POA: Diagnosis not present

## 2016-10-11 DIAGNOSIS — C719 Malignant neoplasm of brain, unspecified: Secondary | ICD-10-CM | POA: Diagnosis not present

## 2016-10-11 DIAGNOSIS — C711 Malignant neoplasm of frontal lobe: Secondary | ICD-10-CM | POA: Diagnosis not present

## 2016-10-12 DIAGNOSIS — Z510 Encounter for antineoplastic radiation therapy: Secondary | ICD-10-CM | POA: Diagnosis not present

## 2016-10-12 DIAGNOSIS — C719 Malignant neoplasm of brain, unspecified: Secondary | ICD-10-CM | POA: Diagnosis not present

## 2016-10-12 DIAGNOSIS — Z79899 Other long term (current) drug therapy: Secondary | ICD-10-CM | POA: Diagnosis not present

## 2016-10-12 DIAGNOSIS — Z5181 Encounter for therapeutic drug level monitoring: Secondary | ICD-10-CM | POA: Diagnosis not present

## 2016-10-12 DIAGNOSIS — C711 Malignant neoplasm of frontal lobe: Secondary | ICD-10-CM | POA: Diagnosis not present

## 2016-10-14 DIAGNOSIS — Z510 Encounter for antineoplastic radiation therapy: Secondary | ICD-10-CM | POA: Diagnosis not present

## 2016-10-14 DIAGNOSIS — C719 Malignant neoplasm of brain, unspecified: Secondary | ICD-10-CM | POA: Diagnosis not present

## 2016-10-14 DIAGNOSIS — Z5181 Encounter for therapeutic drug level monitoring: Secondary | ICD-10-CM | POA: Diagnosis not present

## 2016-10-14 DIAGNOSIS — Z79899 Other long term (current) drug therapy: Secondary | ICD-10-CM | POA: Diagnosis not present

## 2016-10-14 DIAGNOSIS — F4323 Adjustment disorder with mixed anxiety and depressed mood: Secondary | ICD-10-CM | POA: Diagnosis not present

## 2016-10-14 DIAGNOSIS — C711 Malignant neoplasm of frontal lobe: Secondary | ICD-10-CM | POA: Diagnosis not present

## 2016-10-17 DIAGNOSIS — C719 Malignant neoplasm of brain, unspecified: Secondary | ICD-10-CM | POA: Diagnosis not present

## 2016-10-17 DIAGNOSIS — C711 Malignant neoplasm of frontal lobe: Secondary | ICD-10-CM | POA: Diagnosis not present

## 2016-10-17 DIAGNOSIS — Z79899 Other long term (current) drug therapy: Secondary | ICD-10-CM | POA: Diagnosis not present

## 2016-10-17 DIAGNOSIS — Z510 Encounter for antineoplastic radiation therapy: Secondary | ICD-10-CM | POA: Diagnosis not present

## 2016-10-17 DIAGNOSIS — Z5181 Encounter for therapeutic drug level monitoring: Secondary | ICD-10-CM | POA: Diagnosis not present

## 2016-10-18 DIAGNOSIS — C711 Malignant neoplasm of frontal lobe: Secondary | ICD-10-CM | POA: Diagnosis not present

## 2016-10-18 DIAGNOSIS — Z5181 Encounter for therapeutic drug level monitoring: Secondary | ICD-10-CM | POA: Diagnosis not present

## 2016-10-18 DIAGNOSIS — Z510 Encounter for antineoplastic radiation therapy: Secondary | ICD-10-CM | POA: Diagnosis not present

## 2016-10-18 DIAGNOSIS — Z79899 Other long term (current) drug therapy: Secondary | ICD-10-CM | POA: Diagnosis not present

## 2016-10-18 DIAGNOSIS — C719 Malignant neoplasm of brain, unspecified: Secondary | ICD-10-CM | POA: Diagnosis not present

## 2016-10-19 DIAGNOSIS — C719 Malignant neoplasm of brain, unspecified: Secondary | ICD-10-CM | POA: Diagnosis not present

## 2016-10-19 DIAGNOSIS — C711 Malignant neoplasm of frontal lobe: Secondary | ICD-10-CM | POA: Diagnosis not present

## 2016-10-19 DIAGNOSIS — Z510 Encounter for antineoplastic radiation therapy: Secondary | ICD-10-CM | POA: Diagnosis not present

## 2016-10-19 DIAGNOSIS — Z79899 Other long term (current) drug therapy: Secondary | ICD-10-CM | POA: Diagnosis not present

## 2016-10-19 DIAGNOSIS — Z5181 Encounter for therapeutic drug level monitoring: Secondary | ICD-10-CM | POA: Diagnosis not present

## 2016-10-20 DIAGNOSIS — K219 Gastro-esophageal reflux disease without esophagitis: Secondary | ICD-10-CM | POA: Diagnosis not present

## 2016-10-20 DIAGNOSIS — F329 Major depressive disorder, single episode, unspecified: Secondary | ICD-10-CM | POA: Diagnosis not present

## 2016-10-20 DIAGNOSIS — Z510 Encounter for antineoplastic radiation therapy: Secondary | ICD-10-CM | POA: Diagnosis not present

## 2016-10-20 DIAGNOSIS — Z8639 Personal history of other endocrine, nutritional and metabolic disease: Secondary | ICD-10-CM | POA: Diagnosis not present

## 2016-10-20 DIAGNOSIS — C711 Malignant neoplasm of frontal lobe: Secondary | ICD-10-CM | POA: Diagnosis not present

## 2016-10-20 DIAGNOSIS — S0501XA Injury of conjunctiva and corneal abrasion without foreign body, right eye, initial encounter: Secondary | ICD-10-CM | POA: Diagnosis not present

## 2016-10-20 DIAGNOSIS — C713 Malignant neoplasm of parietal lobe: Secondary | ICD-10-CM | POA: Diagnosis not present

## 2016-10-20 DIAGNOSIS — C719 Malignant neoplasm of brain, unspecified: Secondary | ICD-10-CM | POA: Diagnosis not present

## 2016-10-21 ENCOUNTER — Ambulatory Visit (INDEPENDENT_AMBULATORY_CARE_PROVIDER_SITE_OTHER): Payer: Medicare Other | Admitting: Emergency Medicine

## 2016-10-21 ENCOUNTER — Encounter: Payer: Self-pay | Admitting: Emergency Medicine

## 2016-10-21 VITALS — BP 93/63 | HR 87 | Temp 98.7°F | Resp 18 | Ht 68.27 in | Wt 142.6 lb

## 2016-10-21 DIAGNOSIS — H16041 Marginal corneal ulcer, right eye: Secondary | ICD-10-CM | POA: Diagnosis not present

## 2016-10-21 DIAGNOSIS — H5711 Ocular pain, right eye: Secondary | ICD-10-CM

## 2016-10-21 DIAGNOSIS — H5789 Other specified disorders of eye and adnexa: Secondary | ICD-10-CM

## 2016-10-21 DIAGNOSIS — H16001 Unspecified corneal ulcer, right eye: Secondary | ICD-10-CM | POA: Insufficient documentation

## 2016-10-21 NOTE — Progress Notes (Signed)
Kristina Anthony 71 y.o.   Chief Complaint  Patient presents with  . Eye Problem    R eye swelling. Felt something in eye on Monday and swelling happened overnight.    HISTORY OF PRESENT ILLNESS: This is a 71 y.o. female complaining of right eye swelling, redness, and discomfort. Has h/o brain glioblastoma; went to clinic yesterday and started on Erythromycin ointment. Denies significant vision changes.  HPI   Prior to Admission medications   Medication Sig Start Date End Date Taking? Authorizing Provider  acetaminophen (TYLENOL) 325 MG tablet Take 2 tablets (650 mg total) by mouth every 6 (six) hours as needed for mild pain (or Fever >/= 101). 08/02/16  Yes Belkys A Regalado, MD  blood glucose meter kit and supplies Dispense based on patient and insurance preference. Use up to four times daily as directed. (FOR ICD-9 250.00, 250.01). 08/02/16  Yes Belkys A Regalado, MD  clonazePAM (KLONOPIN) 1 MG tablet Take 0.5 mg by mouth at bedtime. 07/19/16  Yes Historical Provider, MD  Cyanocobalamin 200 MCG/SPRAY LIQD Take 1,000 mcg by mouth daily.   Yes Historical Provider, MD  dexamethasone (DECADRON) 4 MG tablet Take 1 tablet (4 mg total) by mouth every 6 (six) hours. 08/02/16  Yes Belkys A Regalado, MD  erythromycin ophthalmic ointment 1 application at bedtime.   Yes Historical Provider, MD  famotidine (PEPCID) 10 MG tablet Take 1 tablet (10 mg total) by mouth 2 (two) times daily. 08/02/16  Yes Belkys A Regalado, MD  hydrOXYzine (VISTARIL) 25 MG capsule Take 25 mg by mouth every 6 (six) hours as needed for itching.  07/25/16  Yes Historical Provider, MD  lamoTRIgine (LAMICTAL) 100 MG tablet Take 100 mg by mouth daily. 07/16/16  Yes Historical Provider, MD  Multiple Vitamin (MULTIVITAMIN) tablet Take 1 tablet by mouth daily.     Yes Historical Provider, MD  NATURE-THROID 65 MG tablet Take 65 mg by mouth daily. 05/20/16  Yes Historical Provider, MD  nortriptyline (PAMELOR) 10 MG capsule Take 20 mg by  mouth at bedtime. 07/22/16  Yes Historical Provider, MD  VIIBRYD 40 MG TABS Take 40 mg by mouth daily. 06/21/16  Yes Historical Provider, MD    Allergies  Allergen Reactions  . No Known Allergies     Patient Active Problem List   Diagnosis Date Noted  . Brain mass 07/29/2016  . 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 Varicose veins of leg with complications 73/42/8768  . Varicose veins of lower extremities with complications 11/57/2620  . Family history of ovarian cancer 08/06/2012  . ESOPHAGEAL STRICTURE 10/26/2009  . GERD 01/26/2009  . CHEST PAIN 01/26/2009  . HYPOTHYROIDISM 11/26/2008  . DEPRESSION 11/26/2008  . CONSTIPATION 11/26/2008  . PERSONAL HX COLONIC POLYPS 11/26/2008    Past Medical History:  Diagnosis Date  . Adenomatous colon polyp 03/2007  . Alcohol abuse   . Allergy   . Anxiety   . Arthritis   . Cancer ([place])    SKIN-REMOVED  . Chronic constipation   . Depression   . Diverticulosis   . Esophageal stricture   . GERD (gastroesophageal reflux disease)   . Glioblastoma multiforme ([place])   . Hemorrhoids   . Hyperlipidemia   . Hypertension   . Hypothyroidism   . Substance abuse     Past Surgical History:  Procedure Laterality Date  . COLONOSCOPY    . EYELID REPAIR W/ SKIN GRAFT    . TONSILLECTOMY  Social History   Social History  . Marital status: Divorced    Spouse name: N/A  . Number of children: 0  . Years of education: N/A   Occupational History  . human resources [employer]   Social History Main Topics  . Smoking status: Former Smoker    Types: Cigarettes  . Smokeless tobacco: Never Used  . Alcohol use 0.0 oz/week     Comment: 3-4 glasses per week-pt states she quit 2013     07/09/2012/sd  . Drug use: No  . Sexual activity: Not on file   Other Topics Concern  . Not on file   Social History Narrative  . No narrative on file    Family History  Problem Relation Age of Onset  . Colon cancer  Mother     at age 55  . Colon polyps Mother   . Prostate cancer Father   . Colon polyps Father   . Colon polyps Brother   . Heart disease Maternal Grandfather      Review of Systems  Constitutional: Negative for chills and fever.  HENT: Negative for ear pain and sore throat.   Eyes: Positive for photophobia, pain and redness. Negative for blurred vision, double vision and discharge.  Respiratory: Negative for shortness of breath.   Cardiovascular: Negative for chest pain and palpitations.  Gastrointestinal: Negative for nausea and vomiting.  Skin: Negative for rash.  Neurological: Positive for weakness. Negative for dizziness, sensory change, speech change, focal weakness and headaches.  All other systems reviewed and are negative.  Vitals:   10/21/16 0944  BP: 93/63  Pulse: 87  Resp: 18  Temp: 98.7 F (37.1 C)     Physical Exam  Constitutional: She is oriented to person, place, and time. She appears well-developed and well-nourished.  HENT:  Head: Normocephalic and atraumatic.  Eyes: EOM and lids are normal. Pupils are equal, round, and reactive to light. Right conjunctiva is injected.    Right corneal ulcer  Neck: Normal range of motion. Neck supple. No JVD present.  Cardiovascular: Normal rate.   Pulmonary/Chest: Effort normal.  Neurological: She is alert and oriented to person, place, and time.  Skin: Skin is warm and dry. Capillary refill takes less than 2 seconds. No rash noted.  Psychiatric: She has a normal mood and affect. Her behavior is normal.  Vitals reviewed.  Eye exams & tests: Tetracaine/Fluorescein examination done. ASSESSMENT & PLAN: Amenda was seen today for eye problem.  Diagnoses and all orders for this visit:  Ulcer of right cornea -     Ambulatory referral to Ophthalmology  Redness of eye, right  Discomfort of right eye     Patient Instructions    You are scheduled to see Dr.  Kathlen Mody today @ 120pm  Bring your insurance  card    IF you received an x-ray today, you will receive an invoice from [facility]. Please contact [facility] at [phone] with questions or concerns regarding your invoice.   IF you received labwork today, you will receive an invoice from [place]. Please contact [facility] at [phone] with questions or concerns regarding your invoice.   Our billing staff will not be able to assist you with questions regarding bills from these companies.  You will be contacted with the lab results as soon as they are available. The fastest way to get your results is to activate your My Chart account. Instructions are located on the last page of this paperwork. If you have not heard from  [place] regarding the results in 2 weeks, please contact this office.      Corneal Ulcer A corneal ulcer is an open sore on the cornea. The cornea is the clear covering at the front and center of the eye. What are the causes? Most corneal ulcers are caused by infection, but there are other causes as well.  Bacterial infection. A bacterial infection can occur and cause a corneal ulcer if:  Contact lenses are worn too long (especially overnight) or are not properly cared for.  An eye injury occurs, allowing bacteria to infect the area of injury.  Viral infection. A viral infection can occur and cause a corneal ulcer if:  The eye becomes infected with a virus, such as the herpes simplex (cold sore) virus, chickenpox virus, or shingles virus.  Fungal infection. A fungal infection can occur and cause a corneal ulcer if:  An eye injury resulted from contact with a plant or plant material.  An anti-inflammatory eye drop is overused.  You have a weakened immune system.  Contact lenses are improperly cared for or become infected.  Foreign bodies in the eye, such as sand, glass, or small pieces of glass or metal.  Dry eyes.  Certain disorders that prevent eyelids from closing completely, such as  Bell's palsy.  Contact lenses, especially extended-wear soft contact lenses. Contact lenses can:  Scratch the cornea's surface, allowing bacteria to enter the scratch.  Trap dirt underneath the contact lens, which can scratch the cornea.  Harbor bacteria and fungi, making it more likely for bacterial infections to occur.  Block oxygen from the cornea, making it more likely for infections to occur. What are the signs or symptoms?  Eye pain that is often severe.  Blurry vision.  Light sensitivity.  Pus or thick discharge coming from your eye.  Eye redness.  Feeling like something is in your eye.  Watery or itchy eye.  Burning or stinging feeling. Some ulcers that are very big may be seen as a white spot on the cornea. How is this diagnosed? An eye exam will be performed. Your health care provider may use a special kind of microscope (slit lamp) to look at the cornea. Eye drops may be put into the eye to make the ulcer easier to see. If it is suspected that an infection caused the corneal ulcer, tissue samples or cultures from the eye may be taken. Numbing eye drops will be given before any samples or cultures are taken. The samples or cultures will be examined in the lab to check for bacteria, viruses, or fungi. How is this treated? Treatment of the corneal ulcer depends on the cause. If your ulcer is severe, you may be given antibiotic eye drops up until your health care provider knows the test results. Other treatments can include:  Antibacterial, antiviral, or antifungal eye drops or ointment.  Removing the foreign body that caused the eye injury.  Artificial tears or a bandage contact lens if severe dry eyes caused the corneal ulcer.  Over-the-counter or prescription pain medicine.  Steroidal eye drops if the eye is inflamed and swollen.  Antibiotic medicines by mouth.  An injection of medicine under the thin membrane covering the eyeball (conjunctiva). This allows  medicine to reach the ulcer in high doses.  Eye patching to reduce irritation from blinking and bright light. An eye patch may not be given if the ulcer was caused by a bacterial infection. If the corneal ulcer causes a scar on the cornea  that interferes with vision, hospitalization and surgery may be needed to replace the cornea (corneal transplant). Follow these instructions at home:  If prescribed, use your antibiotic pills, eye drops, or ointment as directed. Continue using them even if you start to feel better. You may have to apply eye drops as often as every few minutes to every hour, for days. It may be necessary to set your alarm clock every few minutes to every hour during the night. This is absolutely necessary.  Only take over-the-counter or prescription medicines as directed by your health care provider.  Apply artificial tears as needed if you have dry eyes.  Do not touch or rub your eye, because this may increase the irritation and spread the infection.  Avoid wearing eye makeup.  Stay in a dark room and use sunglasses if you have light sensitivity.  Apply cool packs to your eye to relieve discomfort and swelling.  If your eye is patched, you should not drive or use machinery. You will have reduced side vision and ability to judge distance.  Do not drive or operate machinery until approved by your health care provider. Your ability to judge distances is impaired.  Follow up with your health care provider as directed.  Do not wear contact lenses until your health care provider approves. If you normally wear contact lenses, follow these general rules to avoid the risk of a corneal ulcer:  Do not wear contact lenses while you sleep.  Wash your hands before removing contact lenses.  Properly sterilize and store your contact lenses.  Regularly clean your contact lens case.  Do not use your saliva or tap water to clean or wet your contact lenses.  Remove your contact  lenses if your eye becomes irritated. You may put them back in once your eyes feel better. Get help right away if:  You notice a change in your vision.  Your pain is getting worse, not better.  You have increasing discharge from the eye. This information is not intended to replace advice given to you by your health care provider. Make sure you discuss any questions you have with your health care provider. Document Released: 07/14/2004 Document Revised: 11/12/2015 Document Reviewed: 11/06/2012 Elsevier Interactive Patient Education  2017 Elsevier Inc.      Agustina Caroli, MD [facility]

## 2016-10-21 NOTE — Patient Instructions (Addendum)
You are scheduled to see Dr.  Kathlen Mody today @ 120pm  Bring your insurance card    IF you received an x-ray today, you will receive an invoice from [facility]. Please contact [facility] at [phone] with questions or concerns regarding your invoice.   IF you received labwork today, you will receive an invoice from [place]. Please contact [facility] at [phone] with questions or concerns regarding your invoice.   Our billing staff will not be able to assist you with questions regarding bills from these companies.  You will be contacted with the lab results as soon as they are available. The fastest way to get your results is to activate your My Chart account. Instructions are located on the last page of this paperwork. If you have not heard from [place] regarding the results in 2 weeks, please contact this office.      Corneal Ulcer A corneal ulcer is an open sore on the cornea. The cornea is the clear covering at the front and center of the eye. What are the causes? Most corneal ulcers are caused by infection, but there are other causes as well.  Bacterial infection. A bacterial infection can occur and cause a corneal ulcer if:  Contact lenses are worn too long (especially overnight) or are not properly cared for.  An eye injury occurs, allowing bacteria to infect the area of injury.  Viral infection. A viral infection can occur and cause a corneal ulcer if:  The eye becomes infected with a virus, such as the herpes simplex (cold sore) virus, chickenpox virus, or shingles virus.  Fungal infection. A fungal infection can occur and cause a corneal ulcer if:  An eye injury resulted from contact with a plant or plant material.  An anti-inflammatory eye drop is overused.  You have a weakened immune system.  Contact lenses are improperly cared for or become infected.  Foreign bodies in the eye, such as sand, glass, or small pieces of glass or metal.  Dry  eyes.  Certain disorders that prevent eyelids from closing completely, such as Bell's palsy.  Contact lenses, especially extended-wear soft contact lenses. Contact lenses can:  Scratch the cornea's surface, allowing bacteria to enter the scratch.  Trap dirt underneath the contact lens, which can scratch the cornea.  Harbor bacteria and fungi, making it more likely for bacterial infections to occur.  Block oxygen from the cornea, making it more likely for infections to occur. What are the signs or symptoms?  Eye pain that is often severe.  Blurry vision.  Light sensitivity.  Pus or thick discharge coming from your eye.  Eye redness.  Feeling like something is in your eye.  Watery or itchy eye.  Burning or stinging feeling. Some ulcers that are very big may be seen as a white spot on the cornea. How is this diagnosed? An eye exam will be performed. Your health care provider may use a special kind of microscope (slit lamp) to look at the cornea. Eye drops may be put into the eye to make the ulcer easier to see. If it is suspected that an infection caused the corneal ulcer, tissue samples or cultures from the eye may be taken. Numbing eye drops will be given before any samples or cultures are taken. The samples or cultures will be examined in the lab to check for bacteria, viruses, or fungi. How is this treated? Treatment of the corneal ulcer depends on the cause. If your ulcer is severe, you may be  given antibiotic eye drops up until your health care provider knows the test results. Other treatments can include:  Antibacterial, antiviral, or antifungal eye drops or ointment.  Removing the foreign body that caused the eye injury.  Artificial tears or a bandage contact lens if severe dry eyes caused the corneal ulcer.  Over-the-counter or prescription pain medicine.  Steroidal eye drops if the eye is inflamed and swollen.  Antibiotic medicines by mouth.  An injection of  medicine under the thin membrane covering the eyeball (conjunctiva). This allows medicine to reach the ulcer in high doses.  Eye patching to reduce irritation from blinking and bright light. An eye patch may not be given if the ulcer was caused by a bacterial infection. If the corneal ulcer causes a scar on the cornea that interferes with vision, hospitalization and surgery may be needed to replace the cornea (corneal transplant). Follow these instructions at home:  If prescribed, use your antibiotic pills, eye drops, or ointment as directed. Continue using them even if you start to feel better. You may have to apply eye drops as often as every few minutes to every hour, for days. It may be necessary to set your alarm clock every few minutes to every hour during the night. This is absolutely necessary.  Only take over-the-counter or prescription medicines as directed by your health care provider.  Apply artificial tears as needed if you have dry eyes.  Do not touch or rub your eye, because this may increase the irritation and spread the infection.  Avoid wearing eye makeup.  Stay in a dark room and use sunglasses if you have light sensitivity.  Apply cool packs to your eye to relieve discomfort and swelling.  If your eye is patched, you should not drive or use machinery. You will have reduced side vision and ability to judge distance.  Do not drive or operate machinery until approved by your health care provider. Your ability to judge distances is impaired.  Follow up with your health care provider as directed.  Do not wear contact lenses until your health care provider approves. If you normally wear contact lenses, follow these general rules to avoid the risk of a corneal ulcer:  Do not wear contact lenses while you sleep.  Wash your hands before removing contact lenses.  Properly sterilize and store your contact lenses.  Regularly clean your contact lens case.  Do not use your  saliva or tap water to clean or wet your contact lenses.  Remove your contact lenses if your eye becomes irritated. You may put them back in once your eyes feel better. Get help right away if:  You notice a change in your vision.  Your pain is getting worse, not better.  You have increasing discharge from the eye. This information is not intended to replace advice given to you by your health care provider. Make sure you discuss any questions you have with your health care provider. Document Released: 07/14/2004 Document Revised: 11/12/2015 Document Reviewed: 11/06/2012 Elsevier Interactive Patient Education  2017 [employer].

## 2016-10-24 DIAGNOSIS — Z510 Encounter for antineoplastic radiation therapy: Secondary | ICD-10-CM | POA: Diagnosis not present

## 2016-10-24 DIAGNOSIS — Z79899 Other long term (current) drug therapy: Secondary | ICD-10-CM | POA: Diagnosis not present

## 2016-10-24 DIAGNOSIS — C719 Malignant neoplasm of brain, unspecified: Secondary | ICD-10-CM | POA: Diagnosis not present

## 2016-10-24 DIAGNOSIS — C711 Malignant neoplasm of frontal lobe: Secondary | ICD-10-CM | POA: Diagnosis not present

## 2016-10-24 DIAGNOSIS — Z5181 Encounter for therapeutic drug level monitoring: Secondary | ICD-10-CM | POA: Diagnosis not present

## 2016-10-25 DIAGNOSIS — Z79899 Other long term (current) drug therapy: Secondary | ICD-10-CM | POA: Diagnosis not present

## 2016-10-25 DIAGNOSIS — C719 Malignant neoplasm of brain, unspecified: Secondary | ICD-10-CM | POA: Diagnosis not present

## 2016-10-25 DIAGNOSIS — Z5181 Encounter for therapeutic drug level monitoring: Secondary | ICD-10-CM | POA: Diagnosis not present

## 2016-10-25 DIAGNOSIS — Z510 Encounter for antineoplastic radiation therapy: Secondary | ICD-10-CM | POA: Diagnosis not present

## 2016-10-25 DIAGNOSIS — C711 Malignant neoplasm of frontal lobe: Secondary | ICD-10-CM | POA: Diagnosis not present

## 2016-10-26 DIAGNOSIS — Z5181 Encounter for therapeutic drug level monitoring: Secondary | ICD-10-CM | POA: Diagnosis not present

## 2016-10-26 DIAGNOSIS — Z79899 Other long term (current) drug therapy: Secondary | ICD-10-CM | POA: Diagnosis not present

## 2016-10-26 DIAGNOSIS — Z510 Encounter for antineoplastic radiation therapy: Secondary | ICD-10-CM | POA: Diagnosis not present

## 2016-10-26 DIAGNOSIS — F4323 Adjustment disorder with mixed anxiety and depressed mood: Secondary | ICD-10-CM | POA: Diagnosis not present

## 2016-10-26 DIAGNOSIS — C711 Malignant neoplasm of frontal lobe: Secondary | ICD-10-CM | POA: Diagnosis not present

## 2016-10-26 DIAGNOSIS — C719 Malignant neoplasm of brain, unspecified: Secondary | ICD-10-CM | POA: Diagnosis not present

## 2016-10-27 DIAGNOSIS — C711 Malignant neoplasm of frontal lobe: Secondary | ICD-10-CM | POA: Diagnosis not present

## 2016-10-27 DIAGNOSIS — Z79899 Other long term (current) drug therapy: Secondary | ICD-10-CM | POA: Diagnosis not present

## 2016-10-27 DIAGNOSIS — Z510 Encounter for antineoplastic radiation therapy: Secondary | ICD-10-CM | POA: Diagnosis not present

## 2016-10-27 DIAGNOSIS — Z5181 Encounter for therapeutic drug level monitoring: Secondary | ICD-10-CM | POA: Diagnosis not present

## 2016-10-27 DIAGNOSIS — C719 Malignant neoplasm of brain, unspecified: Secondary | ICD-10-CM | POA: Diagnosis not present

## 2016-10-28 DIAGNOSIS — Z510 Encounter for antineoplastic radiation therapy: Secondary | ICD-10-CM | POA: Diagnosis not present

## 2016-10-28 DIAGNOSIS — C719 Malignant neoplasm of brain, unspecified: Secondary | ICD-10-CM | POA: Diagnosis not present

## 2016-10-28 DIAGNOSIS — C711 Malignant neoplasm of frontal lobe: Secondary | ICD-10-CM | POA: Diagnosis not present

## 2016-10-31 DIAGNOSIS — Z510 Encounter for antineoplastic radiation therapy: Secondary | ICD-10-CM | POA: Diagnosis not present

## 2016-10-31 DIAGNOSIS — Z5181 Encounter for therapeutic drug level monitoring: Secondary | ICD-10-CM | POA: Diagnosis not present

## 2016-10-31 DIAGNOSIS — C719 Malignant neoplasm of brain, unspecified: Secondary | ICD-10-CM | POA: Diagnosis not present

## 2016-10-31 DIAGNOSIS — Z79899 Other long term (current) drug therapy: Secondary | ICD-10-CM | POA: Diagnosis not present

## 2016-10-31 DIAGNOSIS — C711 Malignant neoplasm of frontal lobe: Secondary | ICD-10-CM | POA: Diagnosis not present

## 2016-11-01 DIAGNOSIS — Z5181 Encounter for therapeutic drug level monitoring: Secondary | ICD-10-CM | POA: Diagnosis not present

## 2016-11-01 DIAGNOSIS — C711 Malignant neoplasm of frontal lobe: Secondary | ICD-10-CM | POA: Diagnosis not present

## 2016-11-01 DIAGNOSIS — Z79899 Other long term (current) drug therapy: Secondary | ICD-10-CM | POA: Diagnosis not present

## 2016-11-01 DIAGNOSIS — C719 Malignant neoplasm of brain, unspecified: Secondary | ICD-10-CM | POA: Diagnosis not present

## 2016-11-01 DIAGNOSIS — Z510 Encounter for antineoplastic radiation therapy: Secondary | ICD-10-CM | POA: Diagnosis not present

## 2016-11-02 DIAGNOSIS — C719 Malignant neoplasm of brain, unspecified: Secondary | ICD-10-CM | POA: Diagnosis not present

## 2016-11-02 DIAGNOSIS — C711 Malignant neoplasm of frontal lobe: Secondary | ICD-10-CM | POA: Diagnosis not present

## 2016-11-02 DIAGNOSIS — Z5181 Encounter for therapeutic drug level monitoring: Secondary | ICD-10-CM | POA: Diagnosis not present

## 2016-11-02 DIAGNOSIS — Z79899 Other long term (current) drug therapy: Secondary | ICD-10-CM | POA: Diagnosis not present

## 2016-11-02 DIAGNOSIS — Z510 Encounter for antineoplastic radiation therapy: Secondary | ICD-10-CM | POA: Diagnosis not present

## 2016-11-03 DIAGNOSIS — C719 Malignant neoplasm of brain, unspecified: Secondary | ICD-10-CM | POA: Diagnosis not present

## 2016-11-03 DIAGNOSIS — F4323 Adjustment disorder with mixed anxiety and depressed mood: Secondary | ICD-10-CM | POA: Diagnosis not present

## 2016-11-03 DIAGNOSIS — C711 Malignant neoplasm of frontal lobe: Secondary | ICD-10-CM | POA: Diagnosis not present

## 2016-11-03 DIAGNOSIS — F4320 Adjustment disorder, unspecified: Secondary | ICD-10-CM | POA: Diagnosis not present

## 2016-11-03 DIAGNOSIS — Z510 Encounter for antineoplastic radiation therapy: Secondary | ICD-10-CM | POA: Diagnosis not present

## 2016-11-07 DIAGNOSIS — C719 Malignant neoplasm of brain, unspecified: Secondary | ICD-10-CM | POA: Diagnosis not present

## 2016-11-08 DIAGNOSIS — Z79899 Other long term (current) drug therapy: Secondary | ICD-10-CM | POA: Diagnosis not present

## 2016-11-08 DIAGNOSIS — Z510 Encounter for antineoplastic radiation therapy: Secondary | ICD-10-CM | POA: Diagnosis not present

## 2016-11-08 DIAGNOSIS — Z5181 Encounter for therapeutic drug level monitoring: Secondary | ICD-10-CM | POA: Diagnosis not present

## 2016-11-08 DIAGNOSIS — C719 Malignant neoplasm of brain, unspecified: Secondary | ICD-10-CM | POA: Diagnosis not present

## 2016-11-08 DIAGNOSIS — C711 Malignant neoplasm of frontal lobe: Secondary | ICD-10-CM | POA: Diagnosis not present

## 2016-11-09 DIAGNOSIS — C711 Malignant neoplasm of frontal lobe: Secondary | ICD-10-CM | POA: Diagnosis not present

## 2016-11-09 DIAGNOSIS — C719 Malignant neoplasm of brain, unspecified: Secondary | ICD-10-CM | POA: Diagnosis not present

## 2016-11-09 DIAGNOSIS — Z510 Encounter for antineoplastic radiation therapy: Secondary | ICD-10-CM | POA: Diagnosis not present

## 2016-11-09 DIAGNOSIS — Z79899 Other long term (current) drug therapy: Secondary | ICD-10-CM | POA: Diagnosis not present

## 2016-11-09 DIAGNOSIS — Z5181 Encounter for therapeutic drug level monitoring: Secondary | ICD-10-CM | POA: Diagnosis not present

## 2016-11-10 DIAGNOSIS — Z510 Encounter for antineoplastic radiation therapy: Secondary | ICD-10-CM | POA: Diagnosis not present

## 2016-11-10 DIAGNOSIS — C718 Malignant neoplasm of overlapping sites of brain: Secondary | ICD-10-CM | POA: Diagnosis not present

## 2016-11-10 DIAGNOSIS — K219 Gastro-esophageal reflux disease without esophagitis: Secondary | ICD-10-CM | POA: Diagnosis not present

## 2016-11-10 DIAGNOSIS — E079 Disorder of thyroid, unspecified: Secondary | ICD-10-CM | POA: Diagnosis not present

## 2016-11-10 DIAGNOSIS — C711 Malignant neoplasm of frontal lobe: Secondary | ICD-10-CM | POA: Diagnosis not present

## 2016-11-10 DIAGNOSIS — C719 Malignant neoplasm of brain, unspecified: Secondary | ICD-10-CM | POA: Diagnosis not present

## 2016-11-10 DIAGNOSIS — Z923 Personal history of irradiation: Secondary | ICD-10-CM | POA: Diagnosis not present

## 2016-11-10 DIAGNOSIS — F419 Anxiety disorder, unspecified: Secondary | ICD-10-CM | POA: Diagnosis not present

## 2016-11-11 DIAGNOSIS — C719 Malignant neoplasm of brain, unspecified: Secondary | ICD-10-CM | POA: Diagnosis not present

## 2016-11-15 DIAGNOSIS — C719 Malignant neoplasm of brain, unspecified: Secondary | ICD-10-CM | POA: Diagnosis not present

## 2016-11-17 DIAGNOSIS — C719 Malignant neoplasm of brain, unspecified: Secondary | ICD-10-CM | POA: Diagnosis not present

## 2016-11-24 DIAGNOSIS — C719 Malignant neoplasm of brain, unspecified: Secondary | ICD-10-CM | POA: Diagnosis not present

## 2016-12-05 DIAGNOSIS — D61818 Other pancytopenia: Secondary | ICD-10-CM | POA: Diagnosis not present

## 2016-12-05 DIAGNOSIS — F4323 Adjustment disorder with mixed anxiety and depressed mood: Secondary | ICD-10-CM | POA: Diagnosis not present

## 2016-12-05 DIAGNOSIS — R5383 Other fatigue: Secondary | ICD-10-CM | POA: Diagnosis not present

## 2016-12-05 DIAGNOSIS — Z79899 Other long term (current) drug therapy: Secondary | ICD-10-CM | POA: Diagnosis not present

## 2016-12-05 DIAGNOSIS — D72810 Lymphocytopenia: Secondary | ICD-10-CM | POA: Diagnosis not present

## 2016-12-05 DIAGNOSIS — Z8719 Personal history of other diseases of the digestive system: Secondary | ICD-10-CM | POA: Diagnosis not present

## 2016-12-05 DIAGNOSIS — F329 Major depressive disorder, single episode, unspecified: Secondary | ICD-10-CM | POA: Diagnosis not present

## 2016-12-05 DIAGNOSIS — C713 Malignant neoplasm of parietal lobe: Secondary | ICD-10-CM | POA: Diagnosis not present

## 2016-12-05 DIAGNOSIS — C719 Malignant neoplasm of brain, unspecified: Secondary | ICD-10-CM | POA: Diagnosis not present

## 2016-12-05 DIAGNOSIS — R630 Anorexia: Secondary | ICD-10-CM | POA: Diagnosis not present

## 2016-12-05 DIAGNOSIS — E079 Disorder of thyroid, unspecified: Secondary | ICD-10-CM | POA: Diagnosis not present

## 2016-12-13 DIAGNOSIS — Z923 Personal history of irradiation: Secondary | ICD-10-CM | POA: Diagnosis not present

## 2016-12-13 DIAGNOSIS — E079 Disorder of thyroid, unspecified: Secondary | ICD-10-CM | POA: Diagnosis not present

## 2016-12-13 DIAGNOSIS — C719 Malignant neoplasm of brain, unspecified: Secondary | ICD-10-CM | POA: Diagnosis not present

## 2016-12-20 ENCOUNTER — Emergency Department (HOSPITAL_COMMUNITY)
Admission: EM | Admit: 2016-12-20 | Discharge: 2016-12-20 | Disposition: A | Discharge status: Home / Self Care | Payer: Medicare Other | Attending: Emergency Medicine | Admitting: Emergency Medicine

## 2016-12-20 ENCOUNTER — Encounter (HOSPITAL_COMMUNITY): Payer: Self-pay | Admitting: *Deleted

## 2016-12-20 DIAGNOSIS — S0993XA Unspecified injury of face, initial encounter: Secondary | ICD-10-CM | POA: Diagnosis not present

## 2016-12-20 DIAGNOSIS — Z87891 Personal history of nicotine dependence: Secondary | ICD-10-CM | POA: Insufficient documentation

## 2016-12-20 DIAGNOSIS — R112 Nausea with vomiting, unspecified: Secondary | ICD-10-CM | POA: Diagnosis not present

## 2016-12-20 DIAGNOSIS — R630 Anorexia: Secondary | ICD-10-CM | POA: Diagnosis not present

## 2016-12-20 DIAGNOSIS — R2689 Other abnormalities of gait and mobility: Secondary | ICD-10-CM | POA: Diagnosis not present

## 2016-12-20 DIAGNOSIS — E039 Hypothyroidism, unspecified: Secondary | ICD-10-CM | POA: Diagnosis not present

## 2016-12-20 DIAGNOSIS — Z85828 Personal history of other malignant neoplasm of skin: Secondary | ICD-10-CM | POA: Diagnosis not present

## 2016-12-20 DIAGNOSIS — R110 Nausea: Secondary | ICD-10-CM | POA: Diagnosis not present

## 2016-12-20 DIAGNOSIS — C719 Malignant neoplasm of brain, unspecified: Secondary | ICD-10-CM | POA: Insufficient documentation

## 2016-12-20 DIAGNOSIS — I10 Essential (primary) hypertension: Secondary | ICD-10-CM | POA: Diagnosis not present

## 2016-12-20 DIAGNOSIS — R531 Weakness: Secondary | ICD-10-CM | POA: Insufficient documentation

## 2016-12-20 LAB — CBC WITH DIFFERENTIAL/PLATELET
BASOS ABS: 0 10*3/uL (ref 0.0–0.1)
BASOS PCT: 0 %
EOS ABS: 0 10*3/uL (ref 0.0–0.7)
EOS PCT: 0 %
HCT: 33 % — ABNORMAL LOW (ref 36.0–46.0)
HEMOGLOBIN: 11.5 g/dL — AB (ref 12.0–15.0)
Lymphocytes Relative: 23 %
Lymphs Abs: 0.9 10*3/uL (ref 0.7–4.0)
MCH: 32 pg (ref 26.0–34.0)
MCHC: 34.8 g/dL (ref 30.0–36.0)
MCV: 91.9 fL (ref 78.0–100.0)
Monocytes Absolute: 0.4 10*3/uL (ref 0.1–1.0)
Monocytes Relative: 10 %
NEUTROS PCT: 67 %
Neutro Abs: 2.5 10*3/uL (ref 1.7–7.7)
PLATELETS: 133 10*3/uL — AB (ref 150–400)
RBC: 3.59 MIL/uL — AB (ref 3.87–5.11)
RDW: 18.7 % — ABNORMAL HIGH (ref 11.5–15.5)
WBC: 3.8 10*3/uL — AB (ref 4.0–10.5)

## 2016-12-20 LAB — COMPREHENSIVE METABOLIC PANEL
ALK PHOS: 73 U/L (ref 38–126)
ALT: 88 U/L — ABNORMAL HIGH (ref 14–54)
ANION GAP: 7 (ref 5–15)
AST: 46 U/L — ABNORMAL HIGH (ref 15–41)
Albumin: 4.3 g/dL (ref 3.5–5.0)
BUN: 9 mg/dL (ref 6–20)
CALCIUM: 9.6 mg/dL (ref 8.9–10.3)
CO2: 26 mmol/L (ref 22–32)
CREATININE: 0.64 mg/dL (ref 0.44–1.00)
Chloride: 107 mmol/L (ref 101–111)
Glucose, Bld: 111 mg/dL — ABNORMAL HIGH (ref 65–99)
Potassium: 3.9 mmol/L (ref 3.5–5.1)
SODIUM: 140 mmol/L (ref 135–145)
TOTAL PROTEIN: 6.3 g/dL — AB (ref 6.5–8.1)
Total Bilirubin: 0.9 mg/dL (ref 0.3–1.2)

## 2016-12-20 LAB — URINALYSIS, ROUTINE W REFLEX MICROSCOPIC
Bacteria, UA: NONE SEEN
Bilirubin Urine: NEGATIVE
GLUCOSE, UA: NEGATIVE mg/dL
KETONES UR: NEGATIVE mg/dL
LEUKOCYTES UA: NEGATIVE
Nitrite: NEGATIVE
PH: 9 — AB (ref 5.0–8.0)
PROTEIN: NEGATIVE mg/dL
RBC / HPF: NONE SEEN RBC/hpf (ref 0–5)
SQUAMOUS EPITHELIAL / LPF: NONE SEEN
Specific Gravity, Urine: 1.001 — ABNORMAL LOW (ref 1.005–1.030)

## 2016-12-20 LAB — LIPASE, BLOOD: Lipase: 33 U/L (ref 11–51)

## 2016-12-20 LAB — I-STAT CG4 LACTIC ACID, ED: LACTIC ACID, VENOUS: 0.98 mmol/L (ref 0.5–1.9)

## 2016-12-20 MED ORDER — PROMETHAZINE HCL 25 MG RE SUPP: 25.0000 mg | Freq: Four times a day (QID) | RECTAL | 0 refills | Status: AC | PRN

## 2016-12-20 MED ORDER — PROCHLORPERAZINE MALEATE 10 MG PO TABS
10.0000 mg | ORAL_TABLET | Freq: Once | ORAL | Status: AC
  Administered 2016-12-20: 10 mg via ORAL
  Filled 2016-12-20: qty 1

## 2016-12-20 MED ORDER — LORAZEPAM 1 MG PO TABS
1.0000 mg | ORAL_TABLET | Freq: Once | ORAL | Status: AC
  Administered 2016-12-20: 1 mg via ORAL
  Filled 2016-12-20: qty 1

## 2016-12-20 MED ORDER — LORAZEPAM 1 MG PO TABS: 1.0000 mg | ORAL_TABLET | Freq: Three times a day (TID) | ORAL | 0 refills | Status: AC | PRN

## 2016-12-20 MED ORDER — METOCLOPRAMIDE HCL 5 MG/ML IJ SOLN
10.0000 mg | Freq: Once | INTRAMUSCULAR | Status: AC
  Administered 2016-12-20: 10 mg via INTRAVENOUS
  Filled 2016-12-20: qty 2

## 2016-12-20 MED ORDER — SODIUM CHLORIDE 0.9 % IV BOLUS (SEPSIS)
1000.0000 mL | Freq: Once | INTRAVENOUS | Status: AC
  Administered 2016-12-20: 1000 mL via INTRAVENOUS

## 2016-12-20 NOTE — ED Notes (Signed)
Bed: WA23 Expected date:  Expected time:  Means of arrival:  Comments: EMS 

## 2016-12-20 NOTE — Discharge Instructions (Signed)
You have been seen in the Emergency Department (ED) today for nausea and vomiting.  Your work up today has not shown a clear cause for your symptoms. You have been prescribed Ativan and Phenergan; please use as prescribed as needed for your nausea.  Follow up with your doctor as soon as possible regarding today?s emergent visit and your symptoms of nausea.   Return to the ED if you develop abdominal, bloody vomiting, bloody diarrhea, if you are unable to tolerate fluids due to vomiting, or if you develop other symptoms that concern you.

## 2016-12-20 NOTE — ED Triage Notes (Signed)
Per EMS pt coming from home with a c/o nausea, vomiting and poor oral intake x 5 days. Per EMS pt has hx of brain cancer, and recently finished chemotherapy.

## 2016-12-20 NOTE — ED Notes (Signed)
ED Provider at bedside. 

## 2016-12-20 NOTE — ED Notes (Signed)
Pt ambulatory to restroom

## 2016-12-20 NOTE — ED Notes (Signed)
Pt stated when she is able to provide a urine sample she will let this RN know.

## 2016-12-20 NOTE — ED Provider Notes (Signed)
Emergency Department Provider Note   I have reviewed the triage vital signs and the nursing notes.   HISTORY  Chief Complaint Nausea and Poor oral intake (Cancer pt)   HPI Kristina Anthony is a 71 y.o. female with PMH of Glioblastoma multiforme s/p chemotherapy at [facility] 1 month ago presents to the emergency department for evaluation of continued poor appetite with significantly worsening nausea and vomiting. The patient states that during chemotherapy she was having few symptoms related to the chemotherapy. After stopping her appetite and nausea seemed to worsen. She has tried multiple anti-medic medications with no relief. She has tried Marinol for appetite stimulation with no relief. She denies any abdominal pain, chest pain, headache, unilateral weakness, or numbness. She does feel generally very weak. Denies fevers. No syncope. She has tried liquid meal replacements and even milkshakes with almost instant vomiting after those meals.    Past Medical History:  Diagnosis Date  . Adenomatous colon polyp 03/2007  . Alcohol abuse   . Allergy   . Anxiety   . Arthritis   . Cancer ([place])    SKIN-REMOVED  . Chronic constipation   . Depression   . Diverticulosis   . Esophageal stricture   . GERD (gastroesophageal reflux disease)   . Glioblastoma multiforme ([place])   . Hemorrhoids   . Hyperlipidemia   . Hypertension   . Hypothyroidism   . Substance abuse     Patient Active Problem List   Diagnosis Date Noted  . Ulcer of right cornea 10/21/2016  . Brain mass 07/29/2016  . 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 Varicose veins of leg with complications 28/41/3244  . Varicose veins of lower extremities with complications 06/22/7251  . Family history of ovarian cancer 08/06/2012  . ESOPHAGEAL STRICTURE 10/26/2009  . GERD 01/26/2009  . CHEST PAIN 01/26/2009  . HYPOTHYROIDISM 11/26/2008  . DEPRESSION 11/26/2008  . CONSTIPATION 11/26/2008    . PERSONAL HX COLONIC POLYPS 11/26/2008    Past Surgical History:  Procedure Laterality Date  . COLONOSCOPY    . EYELID REPAIR W/ SKIN GRAFT    . TONSILLECTOMY      Current Outpatient Rx  . Order #: [id number] Class: Print  . Order #: [id number] Class: Print  . Order #: [id number] Class: Historical Med  . Order #: [id number] Class: Historical Med  . Order #: [id number] Class: Print  . Order #: [id number] Class: Print  . Order #: [id number] Class: Historical Med  . Order #: [id number] Class: Historical Med  . Order #: [id number] Class: Historical Med  . Order #: [id number] Class: Historical Med  . Order #: [id number] Class: Print  . Order #: [id number] Class: Print    Allergies No known allergies  Family History  Problem Relation Age of Onset  . Colon cancer Mother        at age 10  . Colon polyps Mother   . Prostate cancer Father   . Colon polyps Father   . Colon polyps Brother   . Heart disease Maternal Grandfather     Social History Social History  Substance Use Topics  . Smoking status: Former Smoker    Types: Cigarettes  . Smokeless tobacco: Never Used  . Alcohol use 0.0 oz/week     Comment: 3-4 glasses per week-pt states she quit 2013     07/09/2012/sd    Review of Systems  Constitutional: No fever/chills. Positive generalized weakness.  Eyes: No visual changes. ENT: No sore throat. Cardiovascular: Denies chest pain. Respiratory: Denies shortness of breath.  Gastrointestinal: Negative abdominal pain. Positive nausea and vomiting.  No diarrhea.  No constipation. Genitourinary: Negative for dysuria. Musculoskeletal: Negative for back pain. Skin: Negative for rash. Neurological: Negative for headaches, focal weakness or numbness.  10-point ROS otherwise negative.  ____________________________________________   PHYSICAL EXAM:  VITAL SIGNS: ED Triage Vitals [12/20/16 0931]  Enc Vitals Group     BP 109/71     Pulse Rate 88     Resp 18     Temp 98.9 F (37.2 C)      Temp Source Oral     SpO2 100 %   Constitutional: Alert and oriented. Well appearing and in no acute distress. Notably thin.  Eyes: Conjunctivae are normal.  Head: Atraumatic. Nose: No congestion/rhinnorhea. Mouth/Throat: Mucous membranes are dry.  Neck: No stridor.   Cardiovascular: Normal rate, regular rhythm. Good peripheral circulation. Grossly normal heart sounds.   Respiratory: Normal respiratory effort.  No retractions. Lungs CTAB. Gastrointestinal: Soft and nontender. No distention.  Musculoskeletal: No lower extremity tenderness nor edema. No gross deformities of extremities. Neurologic:  Normal speech and language. No gross focal neurologic deficits are appreciated.  Skin:  Skin is warm, dry and intact. No rash noted. Psychiatric: Mood and affect are normal. Speech and behavior are normal.  ____________________________________________   LABS (all labs ordered are listed, but only abnormal results are displayed)  Labs Reviewed  COMPREHENSIVE METABOLIC PANEL - Abnormal; Notable for the following:       Result Value   Glucose, Bld 111 (*)    Total Protein 6.3 (*)    AST 46 (*)    ALT 88 (*)    All other components within normal limits  CBC WITH DIFFERENTIAL/PLATELET - Abnormal; Notable for the following:    WBC 3.8 (*)    RBC 3.59 (*)    Hemoglobin 11.5 (*)    HCT 33.0 (*)    RDW 18.7 (*)    Platelets 133 (*)    All other components within normal limits  URINALYSIS, ROUTINE W REFLEX MICROSCOPIC - Abnormal; Notable for the following:    Color, Urine COLORLESS (*)    Specific Gravity, Urine 1.001 (*)    pH 9.0 (*)    Hgb urine dipstick SMALL (*)    All other components within normal limits  LIPASE, BLOOD  I-STAT CG4 LACTIC ACID, ED   ____________________________________________   PROCEDURES  Procedure(s) performed:   Procedures  None ____________________________________________   INITIAL IMPRESSION / ASSESSMENT AND PLAN / ED COURSE  Pertinent labs &  imaging results that were available during my care of the patient were reviewed by me and considered in my medical decision making (see chart for details).  Patient presents to the emergency department for evaluation of worsening nausea and vomiting in the setting of known glioblastoma multiforme. Her last chemotherapy was approximately 4 weeks ago. Symptoms started mainly after her chemotherapy. [facility] from late June [facility] is significant for worsening disease. She has follow-up appointment next week with her oncology team to discuss the results of MRI. I did not discuss these results with the patient. Suspect that her nausea and vomiting are multifactorial. No focal neurological deficits to suggest need for repeat head imaging given her MRI in the last week. Plan for labs, IVF, and symptom mgmt.   3:30 PM The patient with little improvement after Reglan, Compazine, and Ativan. She has tried Phenergan and Zofran previously. Symptoms for the last month. No vomiting in the ED and patient is tolerating multiple cups  of water in the ED. She has follow up with oncology early next week. No focal deficits to push for neuro imaging. Labs are largely unremarkable.   At this time, I do not feel there is any life-threatening condition present. I have reviewed and discussed all results (EKG, imaging, lab, urine as appropriate), exam findings with patient. I have reviewed nursing notes and appropriate previous records.  I feel the patient is safe to be discharged home without further emergent workup. Discussed usual and customary return precautions. Patient and family (if present) verbalize understanding and are comfortable with this plan.  Patient will follow-up with their primary care provider. If they do not have a primary care provider, information for follow-up has been provided to them. All questions have been answered.  ____________________________________________  FINAL CLINICAL IMPRESSION(S) / ED  DIAGNOSES  Final diagnoses:  Nausea     MEDICATIONS GIVEN DURING THIS VISIT:  Medications  sodium chloride 0.9 % bolus 1,000 mL (0 mLs Intravenous Stopped 12/20/16 1230)  metoCLOPramide (REGLAN) injection 10 mg (10 mg Intravenous Given 12/20/16 1111)  prochlorperazine (COMPAZINE) tablet 10 mg (10 mg Oral Given 12/20/16 1324)  LORazepam (ATIVAN) tablet 1 mg (1 mg Oral Given 12/20/16 1451)     NEW OUTPATIENT MEDICATIONS STARTED DURING THIS VISIT:  Discharge Medication List as of 12/20/2016  2:58 PM    START taking these medications   Details  LORazepam (ATIVAN) 1 MG tablet Take 1 tablet (1 mg total) by mouth every 8 (eight) hours as needed (nausea)., Starting Tue 12/20/2016, Print    promethazine (PHENERGAN) 25 MG suppository Place 1 suppository (25 mg total) rectally every 6 (six) hours as needed for nausea or vomiting., Starting Tue 12/20/2016, Print          Note:  This document was prepared using Dragon voice recognition software and may include unintentional dictation errors.  Nanda Quinton, MD Emergency Medicine   Sharanda Shinault, [facility], MD 12/20/16 [phone]

## 2016-12-20 NOTE — ED Notes (Signed)
Pt ambulatory and independent at discharge.  Verbalized understanding of discharge instructions 

## 2016-12-20 NOTE — ED Notes (Signed)
Pt tolerating PO liquids and food.

## 2017-01-02 DIAGNOSIS — F419 Anxiety disorder, unspecified: Secondary | ICD-10-CM | POA: Diagnosis not present

## 2017-01-02 DIAGNOSIS — E079 Disorder of thyroid, unspecified: Secondary | ICD-10-CM | POA: Diagnosis not present

## 2017-01-02 DIAGNOSIS — K219 Gastro-esophageal reflux disease without esophagitis: Secondary | ICD-10-CM | POA: Diagnosis not present

## 2017-01-02 DIAGNOSIS — C719 Malignant neoplasm of brain, unspecified: Secondary | ICD-10-CM | POA: Diagnosis not present

## 2017-01-02 DIAGNOSIS — R630 Anorexia: Secondary | ICD-10-CM | POA: Diagnosis not present

## 2017-01-18 DIAGNOSIS — C719 Malignant neoplasm of brain, unspecified: Secondary | ICD-10-CM | POA: Diagnosis not present

## 2017-01-18 DIAGNOSIS — R1312 Dysphagia, oropharyngeal phase: Secondary | ICD-10-CM | POA: Diagnosis not present

## 2017-01-19 DIAGNOSIS — I2510 Atherosclerotic heart disease of native coronary artery without angina pectoris: Secondary | ICD-10-CM | POA: Diagnosis not present

## 2017-01-19 DIAGNOSIS — C713 Malignant neoplasm of parietal lobe: Secondary | ICD-10-CM | POA: Diagnosis not present

## 2017-01-19 DIAGNOSIS — R439 Unspecified disturbances of smell and taste: Secondary | ICD-10-CM | POA: Diagnosis not present

## 2017-01-19 DIAGNOSIS — R740 Nonspecific elevation of levels of transaminase and lactic acid dehydrogenase [LDH]: Secondary | ICD-10-CM | POA: Diagnosis not present

## 2017-01-19 DIAGNOSIS — F4323 Adjustment disorder with mixed anxiety and depressed mood: Secondary | ICD-10-CM | POA: Diagnosis not present

## 2017-01-19 DIAGNOSIS — E079 Disorder of thyroid, unspecified: Secondary | ICD-10-CM | POA: Diagnosis not present

## 2017-01-19 DIAGNOSIS — C719 Malignant neoplasm of brain, unspecified: Secondary | ICD-10-CM | POA: Diagnosis not present

## 2017-01-19 DIAGNOSIS — R630 Anorexia: Secondary | ICD-10-CM | POA: Diagnosis not present

## 2017-01-19 DIAGNOSIS — F419 Anxiety disorder, unspecified: Secondary | ICD-10-CM | POA: Diagnosis not present

## 2017-01-20 DIAGNOSIS — C719 Malignant neoplasm of brain, unspecified: Secondary | ICD-10-CM | POA: Diagnosis not present

## 2017-01-27 DIAGNOSIS — E039 Hypothyroidism, unspecified: Secondary | ICD-10-CM | POA: Diagnosis not present

## 2017-01-27 DIAGNOSIS — Y92009 Unspecified place in unspecified non-institutional (private) residence as the place of occurrence of the external cause: Secondary | ICD-10-CM | POA: Diagnosis not present

## 2017-01-27 DIAGNOSIS — Y999 Unspecified external cause status: Secondary | ICD-10-CM | POA: Diagnosis not present

## 2017-01-27 DIAGNOSIS — I2510 Atherosclerotic heart disease of native coronary artery without angina pectoris: Secondary | ICD-10-CM | POA: Diagnosis not present

## 2017-01-27 DIAGNOSIS — G9389 Other specified disorders of brain: Secondary | ICD-10-CM | POA: Diagnosis not present

## 2017-01-27 DIAGNOSIS — S0001XA Abrasion of scalp, initial encounter: Secondary | ICD-10-CM | POA: Diagnosis not present

## 2017-01-27 DIAGNOSIS — W01198A Fall on same level from slipping, tripping and stumbling with subsequent striking against other object, initial encounter: Secondary | ICD-10-CM | POA: Diagnosis not present

## 2017-01-27 DIAGNOSIS — Z85841 Personal history of malignant neoplasm of brain: Secondary | ICD-10-CM | POA: Diagnosis not present

## 2017-01-27 DIAGNOSIS — S0003XA Contusion of scalp, initial encounter: Secondary | ICD-10-CM | POA: Diagnosis not present

## 2017-01-27 DIAGNOSIS — Z9889 Other specified postprocedural states: Secondary | ICD-10-CM | POA: Diagnosis not present

## 2017-01-27 DIAGNOSIS — W19XXXA Unspecified fall, initial encounter: Secondary | ICD-10-CM | POA: Diagnosis not present

## 2017-01-27 DIAGNOSIS — Z87891 Personal history of nicotine dependence: Secondary | ICD-10-CM | POA: Diagnosis not present

## 2017-01-30 DIAGNOSIS — R945 Abnormal results of liver function studies: Secondary | ICD-10-CM | POA: Diagnosis not present

## 2017-01-30 DIAGNOSIS — F4323 Adjustment disorder with mixed anxiety and depressed mood: Secondary | ICD-10-CM | POA: Diagnosis not present

## 2017-01-30 DIAGNOSIS — R5383 Other fatigue: Secondary | ICD-10-CM | POA: Diagnosis not present

## 2017-01-30 DIAGNOSIS — C711 Malignant neoplasm of frontal lobe: Secondary | ICD-10-CM | POA: Diagnosis not present

## 2017-01-30 DIAGNOSIS — R740 Nonspecific elevation of levels of transaminase and lactic acid dehydrogenase [LDH]: Secondary | ICD-10-CM | POA: Diagnosis not present

## 2017-01-30 DIAGNOSIS — Z205 Contact with and (suspected) exposure to viral hepatitis: Secondary | ICD-10-CM | POA: Diagnosis not present

## 2017-01-30 DIAGNOSIS — I2510 Atherosclerotic heart disease of native coronary artery without angina pectoris: Secondary | ICD-10-CM | POA: Diagnosis not present

## 2017-01-30 DIAGNOSIS — E079 Disorder of thyroid, unspecified: Secondary | ICD-10-CM | POA: Diagnosis not present

## 2017-01-30 DIAGNOSIS — R439 Unspecified disturbances of smell and taste: Secondary | ICD-10-CM | POA: Diagnosis not present

## 2017-01-30 DIAGNOSIS — R1310 Dysphagia, unspecified: Secondary | ICD-10-CM | POA: Diagnosis not present

## 2017-01-30 DIAGNOSIS — F418 Other specified anxiety disorders: Secondary | ICD-10-CM | POA: Diagnosis not present

## 2017-01-30 DIAGNOSIS — R630 Anorexia: Secondary | ICD-10-CM | POA: Diagnosis not present

## 2017-01-30 DIAGNOSIS — C719 Malignant neoplasm of brain, unspecified: Secondary | ICD-10-CM | POA: Diagnosis not present

## 2017-01-30 DIAGNOSIS — C713 Malignant neoplasm of parietal lobe: Secondary | ICD-10-CM | POA: Diagnosis not present

## 2017-02-03 DIAGNOSIS — Z9221 Personal history of antineoplastic chemotherapy: Secondary | ICD-10-CM | POA: Diagnosis not present

## 2017-02-03 DIAGNOSIS — D696 Thrombocytopenia, unspecified: Secondary | ICD-10-CM | POA: Diagnosis not present

## 2017-02-03 DIAGNOSIS — C713 Malignant neoplasm of parietal lobe: Secondary | ICD-10-CM | POA: Diagnosis not present

## 2017-02-03 DIAGNOSIS — Z923 Personal history of irradiation: Secondary | ICD-10-CM | POA: Diagnosis not present

## 2017-02-16 DIAGNOSIS — E079 Disorder of thyroid, unspecified: Secondary | ICD-10-CM | POA: Diagnosis not present

## 2017-02-16 DIAGNOSIS — I2510 Atherosclerotic heart disease of native coronary artery without angina pectoris: Secondary | ICD-10-CM | POA: Diagnosis not present

## 2017-02-16 DIAGNOSIS — C711 Malignant neoplasm of frontal lobe: Secondary | ICD-10-CM | POA: Diagnosis not present

## 2017-02-16 DIAGNOSIS — C719 Malignant neoplasm of brain, unspecified: Secondary | ICD-10-CM | POA: Diagnosis not present

## 2017-02-16 DIAGNOSIS — Z923 Personal history of irradiation: Secondary | ICD-10-CM | POA: Diagnosis not present

## 2017-02-16 DIAGNOSIS — C713 Malignant neoplasm of parietal lobe: Secondary | ICD-10-CM | POA: Diagnosis not present

## 2017-02-22 DIAGNOSIS — C713 Malignant neoplasm of parietal lobe: Secondary | ICD-10-CM | POA: Diagnosis not present

## 2017-02-22 DIAGNOSIS — Z9889 Other specified postprocedural states: Secondary | ICD-10-CM | POA: Diagnosis not present

## 2017-02-22 DIAGNOSIS — C719 Malignant neoplasm of brain, unspecified: Secondary | ICD-10-CM | POA: Diagnosis not present

## 2017-02-27 DIAGNOSIS — C719 Malignant neoplasm of brain, unspecified: Secondary | ICD-10-CM | POA: Diagnosis not present

## 2017-02-27 DIAGNOSIS — F329 Major depressive disorder, single episode, unspecified: Secondary | ICD-10-CM | POA: Diagnosis not present

## 2017-02-27 DIAGNOSIS — F4323 Adjustment disorder with mixed anxiety and depressed mood: Secondary | ICD-10-CM | POA: Diagnosis present

## 2017-02-27 DIAGNOSIS — Z9889 Other specified postprocedural states: Secondary | ICD-10-CM | POA: Diagnosis not present

## 2017-02-27 DIAGNOSIS — Z9689 Presence of other specified functional implants: Secondary | ICD-10-CM | POA: Diagnosis not present

## 2017-02-27 DIAGNOSIS — Z960 Presence of urogenital implants: Secondary | ICD-10-CM | POA: Diagnosis not present

## 2017-02-27 DIAGNOSIS — E872 Acidosis: Secondary | ICD-10-CM | POA: Diagnosis not present

## 2017-02-27 DIAGNOSIS — E039 Hypothyroidism, unspecified: Secondary | ICD-10-CM | POA: Diagnosis present

## 2017-02-27 DIAGNOSIS — C713 Malignant neoplasm of parietal lobe: Secondary | ICD-10-CM | POA: Diagnosis not present

## 2017-02-27 DIAGNOSIS — C718 Malignant neoplasm of overlapping sites of brain: Secondary | ICD-10-CM | POA: Diagnosis present

## 2017-02-27 DIAGNOSIS — Z95828 Presence of other vascular implants and grafts: Secondary | ICD-10-CM | POA: Diagnosis not present

## 2017-02-27 DIAGNOSIS — Z923 Personal history of irradiation: Secondary | ICD-10-CM | POA: Diagnosis not present

## 2017-02-27 DIAGNOSIS — Z79899 Other long term (current) drug therapy: Secondary | ICD-10-CM | POA: Diagnosis not present

## 2017-02-27 DIAGNOSIS — K219 Gastro-esophageal reflux disease without esophagitis: Secondary | ICD-10-CM | POA: Diagnosis present

## 2017-02-27 DIAGNOSIS — Z87891 Personal history of nicotine dependence: Secondary | ICD-10-CM | POA: Diagnosis not present

## 2017-02-27 DIAGNOSIS — Z9981 Dependence on supplemental oxygen: Secondary | ICD-10-CM | POA: Diagnosis not present

## 2017-02-27 DIAGNOSIS — F419 Anxiety disorder, unspecified: Secondary | ICD-10-CM | POA: Diagnosis not present

## 2017-02-27 DIAGNOSIS — Z483 Aftercare following surgery for neoplasm: Secondary | ICD-10-CM | POA: Diagnosis not present

## 2017-03-10 DIAGNOSIS — Z48811 Encounter for surgical aftercare following surgery on the nervous system: Secondary | ICD-10-CM | POA: Diagnosis not present

## 2017-03-14 ENCOUNTER — Encounter (HOSPITAL_BASED_OUTPATIENT_CLINIC_OR_DEPARTMENT_OTHER): Payer: Self-pay | Admitting: *Deleted

## 2017-03-14 ENCOUNTER — Emergency Department (HOSPITAL_BASED_OUTPATIENT_CLINIC_OR_DEPARTMENT_OTHER)
Admission: EM | Admit: 2017-03-14 | Discharge: 2017-03-14 | Disposition: A | Discharge status: Home / Self Care | Payer: Medicare Other | Attending: Emergency Medicine | Admitting: Emergency Medicine

## 2017-03-14 DIAGNOSIS — Z87891 Personal history of nicotine dependence: Secondary | ICD-10-CM | POA: Diagnosis not present

## 2017-03-14 DIAGNOSIS — K5903 Drug induced constipation: Secondary | ICD-10-CM | POA: Insufficient documentation

## 2017-03-14 DIAGNOSIS — E039 Hypothyroidism, unspecified: Secondary | ICD-10-CM | POA: Diagnosis not present

## 2017-03-14 DIAGNOSIS — I10 Essential (primary) hypertension: Secondary | ICD-10-CM | POA: Insufficient documentation

## 2017-03-14 DIAGNOSIS — Z85828 Personal history of other malignant neoplasm of skin: Secondary | ICD-10-CM | POA: Insufficient documentation

## 2017-03-14 DIAGNOSIS — K5900 Constipation, unspecified: Secondary | ICD-10-CM | POA: Diagnosis present

## 2017-03-14 MED ORDER — SENNA-DOCUSATE SODIUM 8.6-50 MG PO TABS: 1.0000 | ORAL_TABLET | Freq: Every day | ORAL | 0 refills | Status: AC

## 2017-03-14 MED ORDER — POLYETHYLENE GLYCOL 3350 17 G PO PACK: 17.0000 g | PACK | Freq: Every day | ORAL | 0 refills | Status: AC

## 2017-03-14 MED FILL — SENEXON-S TABLET: 8.6-50 | 30 days supply | Qty: 100 | Fill #0

## 2017-03-14 MED FILL — POLYETHYLENE GLYCOL 3350: 30 days supply | Qty: 255 | Fill #0

## 2017-03-14 NOTE — ED Notes (Signed)
Pt directed to pharmacy to pick up RX

## 2017-03-14 NOTE — Discharge Instructions (Signed)
You were seen in the emergency department today for constipation.  We recommend that you use one or more of the following over-the-counter medications in the order described: °  °1)  Colace (or Dulcolax) 100 mg:  This is a stool softener, and you may take it once or twice a day as needed. °2)  Senna tablets:  This is a bowel stimulant that will help "push" out your stool. It is the next step to add after you have tried a stool softener. °3)  Miralax (powder):  This medication works by drawing additional fluid into your intestines and helps to flush out your stool.  Mix the powder with water or juice according to label instructions.  It may help if the Colace and Senna are not sufficient, but you must be sure to use the recommended amount of water or juice when you mix up the powder. °Remember that narcotic pain medications are constipating, so avoid them or minimize their use.  Drink plenty of fluids. ° °Please return to the Emergency Department immediately if you develop new or worsening symptoms that concern you, such as (but not limited to) fever > 101 degrees, severe abdominal pain, or persistent vomiting. ° ° °Constipation °Constipation is when a person has fewer than three bowel movements a week, has difficulty having a bowel movement, or has stools that are dry, hard, or larger than normal. As people grow older, constipation is more common. If you try to fix constipation with medicines that make you have a bowel movement (laxatives), the problem may get worse. Kristina Anthony Mccleery-term laxative use may cause the muscles of the colon to become weak. A low-fiber diet, not taking in enough fluids, and taking certain medicines may make constipation worse.  °CAUSES  °Certain medicines, such as antidepressants, pain medicine, iron supplements, antacids, and water pills.   °Certain diseases, such as diabetes, irritable bowel syndrome (IBS), thyroid disease, or depression.   °Not drinking enough water.   °Not eating enough  fiber-rich foods.   °Stress or travel.   °Lack of physical activity or exercise.   °Ignoring the urge to have a bowel movement.   °Using laxatives too much.   °SIGNS AND SYMPTOMS  °Having fewer than three bowel movements a week.   °Straining to have a bowel movement.   °Having stools that are hard, dry, or larger than normal.   °Feeling full or bloated.   °Pain in the lower abdomen.   °Not feeling relief after having a bowel movement.   °DIAGNOSIS  °Your health care provider will take a medical history and perform a physical exam. Further testing may be done for severe constipation. Some tests may include: °A barium enema X-ray to examine your rectum, colon, and, sometimes, your small intestine.   °A sigmoidoscopy to examine your lower colon.   °A colonoscopy to examine your entire colon. °TREATMENT  °Treatment will depend on the severity of your constipation and what is causing it. Some dietary treatments include drinking more fluids and eating more fiber-rich foods. Lifestyle treatments may include regular exercise. If these diet and lifestyle recommendations do not help, your health care provider may recommend taking over-the-counter laxative medicines to help you have bowel movements. Prescription medicines may be prescribed if over-the-counter medicines do not work.  °HOME CARE INSTRUCTIONS  °Eat foods that have a lot of fiber, such as fruits, vegetables, whole grains, and beans. °Limit foods high in fat and processed sugars, such as french fries, hamburgers, cookies, candies, and soda.   °A   fiber supplement may be added to your diet if you cannot get enough fiber from foods.   °Drink enough fluids to keep your urine clear or pale yellow.   °Exercise regularly or as directed by your health care provider.   °Go to the restroom when you have the urge to go. Do not hold it.   °Only take over-the-counter or prescription medicines as directed by your health care provider. Do not take other medicines for constipation  without talking to your health care provider first.   °SEEK IMMEDIATE MEDICAL CARE IF:  °You have bright red blood in your stool.   °Your constipation lasts for more than 4 days or gets worse.   °You have abdominal or rectal pain.   °You have thin, pencil-like stools.   °You have unexplained weight loss. °MAKE SURE YOU:  °Understand these instructions. °Will watch your condition. °Will get help right away if you are not doing well or get worse. °Document Released: 03/04/2004 Document Revised: 06/11/2013 Document Reviewed: 03/18/2013 °Exit[employer]® Patient Information ©2015 [employer]. This information is not intended to replace advice given to you by your health care provider. Make sure you discuss any questions you have with your health care provider. ° ° ° °

## 2017-03-14 NOTE — ED Triage Notes (Signed)
Constipated. Rectal pain.

## 2017-03-14 NOTE — ED Provider Notes (Signed)
Emergency Department Provider Note   I have reviewed the triage vital signs and the nursing notes.   HISTORY  Chief Complaint Constipation   HPI Kristina Anthony is a 71 y.o. female with PMH of HLD, HTN, and Diverticulosis presents to the emergency department for evaluation of constipation and rectal pain. Patient had recent neurosurgery and has been taking pain medications. She has no complaints related to the surgery but states that the pain medication has made her more constipated. She feels that she has a lot of pressure in her rectum. She is passing stool but states it's watery with occasional hard "balls" of stool. No fevers or chills. No vomiting.   Past Medical History:  Diagnosis Date  . Adenomatous colon polyp 03/2007  . Alcohol abuse   . Allergy   . Anxiety   . Arthritis   . Cancer ([place])    SKIN-REMOVED  . Chronic constipation   . Depression   . Diverticulosis   . Esophageal stricture   . GERD (gastroesophageal reflux disease)   . Glioblastoma multiforme ([place])   . Hemorrhoids   . Hyperlipidemia   . Hypertension   . Hypothyroidism   . Substance abuse     Patient Active Problem List   Diagnosis Date Noted  . Ulcer of right cornea 10/21/2016  . Brain mass 07/29/2016  . 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 Varicose veins of leg with complications 06/00/4599  . Varicose veins of lower extremities with complications 77/41/4239  . Family history of ovarian cancer 08/06/2012  . ESOPHAGEAL STRICTURE 10/26/2009  . GERD 01/26/2009  . CHEST PAIN 01/26/2009  . HYPOTHYROIDISM 11/26/2008  . DEPRESSION 11/26/2008  . CONSTIPATION 11/26/2008  . PERSONAL HX COLONIC POLYPS 11/26/2008    Past Surgical History:  Procedure Laterality Date  . BRAIN SURGERY    . COLONOSCOPY    . EYELID REPAIR W/ SKIN GRAFT    . TONSILLECTOMY      Current Outpatient Rx  . Order #: [id number] Class: Print  . Order #: [id number] Class: Print    . Order #: [id number] Class: Historical Med  . Order #: [id number] Class: Historical Med  . Order #: [id number] Class: Print  . Order #: [id number] Class: Print  . Order #: [id number] Class: Historical Med  . Order #: [id number] Class: Print  . Order #: [id number] Class: Historical Med  . Order #: [id number] Class: Historical Med  . Order #: [id number] Class: Historical Med  . Order #: [id number] Class: Print  . Order #: [id number] Class: Print  . Order #: [id number] Class: Print    Allergies No known allergies  Family History  Problem Relation Age of Onset  . Colon cancer Mother        at age 56  . Colon polyps Mother   . Prostate cancer Father   . Colon polyps Father   . Colon polyps Brother   . Heart disease Maternal Grandfather     Social History Social History  Substance Use Topics  . Smoking status: Former Smoker    Types: Cigarettes  . Smokeless tobacco: Never Used  . Alcohol use 0.0 oz/week     Comment: 3-4 glasses per week-pt states she quit 2013     07/09/2012/sd    Review of Systems  Constitutional: No fever/chills Eyes: No visual changes. ENT: No sore throat. Cardiovascular: Denies chest pain. Respiratory: Denies shortness of breath. Gastrointestinal: No abdominal pain.  No nausea, no vomiting.  No diarrhea. Positive constipation. Genitourinary: Negative for dysuria. Musculoskeletal: Negative for back pain.  Skin: Negative for rash. Neurological: Negative for headaches, focal weakness or numbness.  10-point ROS otherwise negative.  ____________________________________________   PHYSICAL EXAM:  VITAL SIGNS: ED Triage Vitals  Enc Vitals Group     BP 03/14/17 1104 112/74     Pulse Rate 03/14/17 1104 (!) 108     Resp 03/14/17 1104 18     Temp 03/14/17 1104 98.1 F (36.7 C)     Temp Source 03/14/17 1104 Oral     SpO2 03/14/17 1104 100 %     Weight 03/14/17 1102 133 lb (60.3 kg)     Height 03/14/17 1102 5' 8.5" (1.74 m)     Pain Score 03/14/17 1101 7    Constitutional: Alert and oriented. Well appearing and in no acute distress. Eyes: Conjunctivae are normal.  Head: Atraumatic. Nose: No congestion/rhinnorhea. Mouth/Throat: Mucous membranes are moist.  Neck: No stridor.  Cardiovascular: Normal rate, regular rhythm. Good peripheral circulation. Grossly normal heart sounds.   Respiratory: Normal respiratory effort.  No retractions. Lungs CTAB. Gastrointestinal: Soft and nontender. No distention. Rectal exam with small rectal fissure. No perirectal abscess. No visible hemorrhoid. Soft stool in the rectum with no fecal impaction appreciated.  Musculoskeletal: No lower extremity tenderness nor edema. No gross deformities of extremities. Neurologic:  Normal speech and language. No gross focal neurologic deficits are appreciated.  Skin:  Skin is warm, dry and intact. No rash noted.  ____________________________________________   PROCEDURES  Procedure(s) performed:   Procedures  None ____________________________________________   INITIAL IMPRESSION / ASSESSMENT AND PLAN / ED COURSE  Pertinent labs & imaging results that were available during my care of the patient were reviewed by me and considered in my medical decision making (see chart for details).  Patient presents to the ED for evaluation of constipation. She is taking opiate pain medications after recent surgery. Taking Miralax with no results. Some rectal pain likely 2/2 small fissure noted on exam. No other abnormalities on exam. No fecal impaction of exam. Advised enema use at home with continued miralax use and Peircolace. Abdomen is completely soft and non-tender. No evidence to suspect bowel obstruction.   At this time, I do not feel there is any life-threatening condition present. I have reviewed and discussed all results (EKG, imaging, lab, urine as appropriate), exam findings with patient. I have reviewed nursing notes and appropriate previous records.  I feel the patient  is safe to be discharged home without further emergent workup. Discussed usual and customary return precautions. Patient and family (if present) verbalize understanding and are comfortable with this plan.  Patient will follow-up with their primary care provider. If they do not have a primary care provider, information for follow-up has been provided to them. All questions have been answered.    ____________________________________________  FINAL CLINICAL IMPRESSION(S) / ED DIAGNOSES  Final diagnoses:  Drug-induced constipation     MEDICATIONS GIVEN DURING THIS VISIT:  Medications - No data to display   NEW OUTPATIENT MEDICATIONS STARTED DURING THIS VISIT:  Discharge Medication List as of 03/14/2017 11:22 AM    START taking these medications   Details  polyethylene glycol (MIRALAX) packet Take 17 g by mouth daily., Starting Tue 03/14/2017, Print    sennosides-docusate sodium (SENOKOT-S) 8.6-50 MG tablet Take 1 tablet by mouth daily., Starting Tue 03/14/2017, Until Tue 03/28/2017, Print        Note:  This document was prepared using Dragon voice recognition software and may include unintentional dictation errors.  Nanda Quinton, MD Emergency Medicine  Margette Fast, MD 03/15/17 [phone]

## 2017-03-20 DIAGNOSIS — D496 Neoplasm of unspecified behavior of brain: Secondary | ICD-10-CM | POA: Diagnosis not present

## 2017-03-20 DIAGNOSIS — C719 Malignant neoplasm of brain, unspecified: Secondary | ICD-10-CM | POA: Diagnosis not present

## 2017-03-20 DIAGNOSIS — F419 Anxiety disorder, unspecified: Secondary | ICD-10-CM | POA: Diagnosis not present

## 2017-03-22 ENCOUNTER — Other Ambulatory Visit: Payer: Self-pay | Admitting: Gastroenterology

## 2017-03-22 DIAGNOSIS — R1310 Dysphagia, unspecified: Secondary | ICD-10-CM

## 2017-03-22 DIAGNOSIS — Z8719 Personal history of other diseases of the digestive system: Secondary | ICD-10-CM | POA: Diagnosis not present

## 2017-03-22 DIAGNOSIS — F4323 Adjustment disorder with mixed anxiety and depressed mood: Secondary | ICD-10-CM | POA: Diagnosis not present

## 2017-03-22 DIAGNOSIS — Z800 Family history of malignant neoplasm of digestive organs: Secondary | ICD-10-CM | POA: Diagnosis not present

## 2017-03-22 DIAGNOSIS — R1319 Other dysphagia: Secondary | ICD-10-CM

## 2017-03-22 DIAGNOSIS — K219 Gastro-esophageal reflux disease without esophagitis: Secondary | ICD-10-CM | POA: Diagnosis not present

## 2017-03-22 DIAGNOSIS — F39 Unspecified mood [affective] disorder: Secondary | ICD-10-CM | POA: Diagnosis not present

## 2017-03-22 DIAGNOSIS — Z86010 Personal history of colonic polyps: Secondary | ICD-10-CM | POA: Diagnosis not present

## 2017-03-22 DIAGNOSIS — C719 Malignant neoplasm of brain, unspecified: Secondary | ICD-10-CM | POA: Diagnosis not present

## 2017-03-22 DIAGNOSIS — D696 Thrombocytopenia, unspecified: Secondary | ICD-10-CM | POA: Diagnosis not present

## 2017-03-29 ENCOUNTER — Encounter (HOSPITAL_COMMUNITY): Payer: Self-pay | Admitting: Emergency Medicine

## 2017-03-29 ENCOUNTER — Emergency Department (HOSPITAL_COMMUNITY)
Admission: EM | Admit: 2017-03-29 | Discharge: 2017-03-30 | Discharge status: Against Medical Advice | Payer: Medicare Other | Attending: Emergency Medicine | Admitting: Emergency Medicine

## 2017-03-29 DIAGNOSIS — Z9889 Other specified postprocedural states: Secondary | ICD-10-CM | POA: Diagnosis not present

## 2017-03-29 DIAGNOSIS — F419 Anxiety disorder, unspecified: Secondary | ICD-10-CM | POA: Insufficient documentation

## 2017-03-29 DIAGNOSIS — C719 Malignant neoplasm of brain, unspecified: Secondary | ICD-10-CM | POA: Diagnosis not present

## 2017-03-29 NOTE — ED Triage Notes (Signed)
Pt reports feeling like an engine is running on the inside of her since yesterday. She's attempted to speak with her psychiatrist but has not received a call back. Pt is a/o denies n/v/d.

## 2017-03-29 NOTE — ED Notes (Signed)
Called pt, no response. 

## 2017-03-29 NOTE — ED Notes (Signed)
Pt was called again and no answer

## 2017-03-30 DIAGNOSIS — F5101 Primary insomnia: Secondary | ICD-10-CM | POA: Diagnosis not present

## 2017-03-30 DIAGNOSIS — E039 Hypothyroidism, unspecified: Secondary | ICD-10-CM | POA: Diagnosis not present

## 2017-03-30 DIAGNOSIS — F410 Panic disorder [episodic paroxysmal anxiety] without agoraphobia: Secondary | ICD-10-CM | POA: Diagnosis not present

## 2017-03-30 DIAGNOSIS — Z5181 Encounter for therapeutic drug level monitoring: Secondary | ICD-10-CM | POA: Diagnosis not present

## 2017-03-30 DIAGNOSIS — C719 Malignant neoplasm of brain, unspecified: Secondary | ICD-10-CM | POA: Diagnosis not present

## 2017-03-31 ENCOUNTER — Other Ambulatory Visit: Payer: Self-pay

## 2017-04-12 DIAGNOSIS — I6203 Nontraumatic chronic subdural hemorrhage: Secondary | ICD-10-CM | POA: Diagnosis not present

## 2017-04-12 DIAGNOSIS — Z9221 Personal history of antineoplastic chemotherapy: Secondary | ICD-10-CM | POA: Diagnosis not present

## 2017-04-12 DIAGNOSIS — F410 Panic disorder [episodic paroxysmal anxiety] without agoraphobia: Secondary | ICD-10-CM | POA: Diagnosis present

## 2017-04-12 DIAGNOSIS — F17210 Nicotine dependence, cigarettes, uncomplicated: Secondary | ICD-10-CM | POA: Diagnosis not present

## 2017-04-12 DIAGNOSIS — K219 Gastro-esophageal reflux disease without esophagitis: Secondary | ICD-10-CM | POA: Diagnosis present

## 2017-04-12 DIAGNOSIS — Z87891 Personal history of nicotine dependence: Secondary | ICD-10-CM | POA: Diagnosis not present

## 2017-04-12 DIAGNOSIS — Z79899 Other long term (current) drug therapy: Secondary | ICD-10-CM | POA: Diagnosis not present

## 2017-04-12 DIAGNOSIS — Z85841 Personal history of malignant neoplasm of brain: Secondary | ICD-10-CM | POA: Diagnosis not present

## 2017-04-12 DIAGNOSIS — G939 Disorder of brain, unspecified: Secondary | ICD-10-CM | POA: Diagnosis not present

## 2017-04-12 DIAGNOSIS — R208 Other disturbances of skin sensation: Secondary | ICD-10-CM | POA: Diagnosis present

## 2017-04-12 DIAGNOSIS — R531 Weakness: Secondary | ICD-10-CM | POA: Diagnosis not present

## 2017-04-12 DIAGNOSIS — Z923 Personal history of irradiation: Secondary | ICD-10-CM | POA: Diagnosis not present

## 2017-04-12 DIAGNOSIS — F419 Anxiety disorder, unspecified: Secondary | ICD-10-CM | POA: Diagnosis present

## 2017-04-12 DIAGNOSIS — M21372 Foot drop, left foot: Secondary | ICD-10-CM | POA: Diagnosis present

## 2017-04-12 DIAGNOSIS — F1011 Alcohol abuse, in remission: Secondary | ICD-10-CM | POA: Diagnosis present

## 2017-04-12 DIAGNOSIS — Z66 Do not resuscitate: Secondary | ICD-10-CM | POA: Diagnosis present

## 2017-04-12 DIAGNOSIS — R569 Unspecified convulsions: Secondary | ICD-10-CM | POA: Diagnosis not present

## 2017-04-12 DIAGNOSIS — C719 Malignant neoplasm of brain, unspecified: Secondary | ICD-10-CM | POA: Diagnosis not present

## 2017-04-12 DIAGNOSIS — G960 Cerebrospinal fluid leak: Secondary | ICD-10-CM | POA: Diagnosis not present

## 2017-04-12 DIAGNOSIS — G936 Cerebral edema: Secondary | ICD-10-CM | POA: Diagnosis not present

## 2017-04-12 DIAGNOSIS — K5900 Constipation, unspecified: Secondary | ICD-10-CM | POA: Diagnosis present

## 2017-04-12 DIAGNOSIS — E039 Hypothyroidism, unspecified: Secondary | ICD-10-CM | POA: Diagnosis present

## 2017-04-17 DIAGNOSIS — E079 Disorder of thyroid, unspecified: Secondary | ICD-10-CM | POA: Diagnosis not present

## 2017-04-17 DIAGNOSIS — F4323 Adjustment disorder with mixed anxiety and depressed mood: Secondary | ICD-10-CM | POA: Diagnosis not present

## 2017-04-17 DIAGNOSIS — C713 Malignant neoplasm of parietal lobe: Secondary | ICD-10-CM | POA: Diagnosis not present

## 2017-04-17 DIAGNOSIS — Z66 Do not resuscitate: Secondary | ICD-10-CM | POA: Diagnosis not present

## 2017-04-17 DIAGNOSIS — C719 Malignant neoplasm of brain, unspecified: Secondary | ICD-10-CM | POA: Diagnosis not present

## 2017-04-17 DIAGNOSIS — K219 Gastro-esophageal reflux disease without esophagitis: Secondary | ICD-10-CM | POA: Diagnosis not present

## 2017-04-17 DIAGNOSIS — R569 Unspecified convulsions: Secondary | ICD-10-CM | POA: Diagnosis not present

## 2017-04-17 DIAGNOSIS — F410 Panic disorder [episodic paroxysmal anxiety] without agoraphobia: Secondary | ICD-10-CM | POA: Diagnosis not present

## 2017-04-17 DIAGNOSIS — F419 Anxiety disorder, unspecified: Secondary | ICD-10-CM | POA: Diagnosis not present

## 2017-04-19 DIAGNOSIS — C719 Malignant neoplasm of brain, unspecified: Secondary | ICD-10-CM | POA: Diagnosis not present

## 2017-04-19 DIAGNOSIS — F339 Major depressive disorder, recurrent, unspecified: Secondary | ICD-10-CM | POA: Diagnosis not present

## 2017-04-19 DIAGNOSIS — E039 Hypothyroidism, unspecified: Secondary | ICD-10-CM | POA: Diagnosis not present

## 2017-04-19 DIAGNOSIS — K219 Gastro-esophageal reflux disease without esophagitis: Secondary | ICD-10-CM | POA: Diagnosis not present

## 2017-04-19 DIAGNOSIS — I2510 Atherosclerotic heart disease of native coronary artery without angina pectoris: Secondary | ICD-10-CM | POA: Diagnosis not present

## 2017-04-19 DIAGNOSIS — I10 Essential (primary) hypertension: Secondary | ICD-10-CM | POA: Diagnosis not present

## 2017-04-19 DIAGNOSIS — R569 Unspecified convulsions: Secondary | ICD-10-CM | POA: Diagnosis not present

## 2017-04-20 DIAGNOSIS — I2510 Atherosclerotic heart disease of native coronary artery without angina pectoris: Secondary | ICD-10-CM | POA: Diagnosis not present

## 2017-04-20 DIAGNOSIS — I10 Essential (primary) hypertension: Secondary | ICD-10-CM | POA: Diagnosis not present

## 2017-04-20 DIAGNOSIS — C719 Malignant neoplasm of brain, unspecified: Secondary | ICD-10-CM | POA: Diagnosis not present

## 2017-04-20 DIAGNOSIS — F339 Major depressive disorder, recurrent, unspecified: Secondary | ICD-10-CM | POA: Diagnosis not present

## 2017-04-20 DIAGNOSIS — K219 Gastro-esophageal reflux disease without esophagitis: Secondary | ICD-10-CM | POA: Diagnosis not present

## 2017-04-20 DIAGNOSIS — E039 Hypothyroidism, unspecified: Secondary | ICD-10-CM | POA: Diagnosis not present

## 2017-04-20 DIAGNOSIS — R569 Unspecified convulsions: Secondary | ICD-10-CM | POA: Diagnosis not present

## 2017-04-24 DIAGNOSIS — I2510 Atherosclerotic heart disease of native coronary artery without angina pectoris: Secondary | ICD-10-CM | POA: Diagnosis not present

## 2017-04-24 DIAGNOSIS — I10 Essential (primary) hypertension: Secondary | ICD-10-CM | POA: Diagnosis not present

## 2017-04-24 DIAGNOSIS — C719 Malignant neoplasm of brain, unspecified: Secondary | ICD-10-CM | POA: Diagnosis not present

## 2017-04-24 DIAGNOSIS — K219 Gastro-esophageal reflux disease without esophagitis: Secondary | ICD-10-CM | POA: Diagnosis not present

## 2017-04-24 DIAGNOSIS — F339 Major depressive disorder, recurrent, unspecified: Secondary | ICD-10-CM | POA: Diagnosis not present

## 2017-04-24 DIAGNOSIS — R569 Unspecified convulsions: Secondary | ICD-10-CM | POA: Diagnosis not present

## 2017-04-25 DIAGNOSIS — R569 Unspecified convulsions: Secondary | ICD-10-CM | POA: Diagnosis not present

## 2017-04-25 DIAGNOSIS — K219 Gastro-esophageal reflux disease without esophagitis: Secondary | ICD-10-CM | POA: Diagnosis not present

## 2017-04-25 DIAGNOSIS — F339 Major depressive disorder, recurrent, unspecified: Secondary | ICD-10-CM | POA: Diagnosis not present

## 2017-04-25 DIAGNOSIS — I10 Essential (primary) hypertension: Secondary | ICD-10-CM | POA: Diagnosis not present

## 2017-04-25 DIAGNOSIS — I2510 Atherosclerotic heart disease of native coronary artery without angina pectoris: Secondary | ICD-10-CM | POA: Diagnosis not present

## 2017-04-25 DIAGNOSIS — C719 Malignant neoplasm of brain, unspecified: Secondary | ICD-10-CM | POA: Diagnosis not present

## 2017-04-27 DIAGNOSIS — K219 Gastro-esophageal reflux disease without esophagitis: Secondary | ICD-10-CM | POA: Diagnosis not present

## 2017-04-27 DIAGNOSIS — I10 Essential (primary) hypertension: Secondary | ICD-10-CM | POA: Diagnosis not present

## 2017-04-27 DIAGNOSIS — F339 Major depressive disorder, recurrent, unspecified: Secondary | ICD-10-CM | POA: Diagnosis not present

## 2017-04-27 DIAGNOSIS — I2510 Atherosclerotic heart disease of native coronary artery without angina pectoris: Secondary | ICD-10-CM | POA: Diagnosis not present

## 2017-04-27 DIAGNOSIS — C719 Malignant neoplasm of brain, unspecified: Secondary | ICD-10-CM | POA: Diagnosis not present

## 2017-04-27 DIAGNOSIS — R569 Unspecified convulsions: Secondary | ICD-10-CM | POA: Diagnosis not present

## 2017-05-02 DIAGNOSIS — I10 Essential (primary) hypertension: Secondary | ICD-10-CM | POA: Diagnosis not present

## 2017-05-02 DIAGNOSIS — F339 Major depressive disorder, recurrent, unspecified: Secondary | ICD-10-CM | POA: Diagnosis not present

## 2017-05-02 DIAGNOSIS — K219 Gastro-esophageal reflux disease without esophagitis: Secondary | ICD-10-CM | POA: Diagnosis not present

## 2017-05-02 DIAGNOSIS — R569 Unspecified convulsions: Secondary | ICD-10-CM | POA: Diagnosis not present

## 2017-05-02 DIAGNOSIS — I2510 Atherosclerotic heart disease of native coronary artery without angina pectoris: Secondary | ICD-10-CM | POA: Diagnosis not present

## 2017-05-02 DIAGNOSIS — C719 Malignant neoplasm of brain, unspecified: Secondary | ICD-10-CM | POA: Diagnosis not present

## 2017-05-03 DIAGNOSIS — K219 Gastro-esophageal reflux disease without esophagitis: Secondary | ICD-10-CM | POA: Diagnosis not present

## 2017-05-03 DIAGNOSIS — F339 Major depressive disorder, recurrent, unspecified: Secondary | ICD-10-CM | POA: Diagnosis not present

## 2017-05-03 DIAGNOSIS — C719 Malignant neoplasm of brain, unspecified: Secondary | ICD-10-CM | POA: Diagnosis not present

## 2017-05-03 DIAGNOSIS — I10 Essential (primary) hypertension: Secondary | ICD-10-CM | POA: Diagnosis not present

## 2017-05-03 DIAGNOSIS — I2510 Atherosclerotic heart disease of native coronary artery without angina pectoris: Secondary | ICD-10-CM | POA: Diagnosis not present

## 2017-05-03 DIAGNOSIS — R569 Unspecified convulsions: Secondary | ICD-10-CM | POA: Diagnosis not present

## 2017-05-07 DIAGNOSIS — K219 Gastro-esophageal reflux disease without esophagitis: Secondary | ICD-10-CM | POA: Diagnosis not present

## 2017-05-07 DIAGNOSIS — R569 Unspecified convulsions: Secondary | ICD-10-CM | POA: Diagnosis not present

## 2017-05-07 DIAGNOSIS — I10 Essential (primary) hypertension: Secondary | ICD-10-CM | POA: Diagnosis not present

## 2017-05-07 DIAGNOSIS — I2510 Atherosclerotic heart disease of native coronary artery without angina pectoris: Secondary | ICD-10-CM | POA: Diagnosis not present

## 2017-05-07 DIAGNOSIS — C719 Malignant neoplasm of brain, unspecified: Secondary | ICD-10-CM | POA: Diagnosis not present

## 2017-05-07 DIAGNOSIS — F339 Major depressive disorder, recurrent, unspecified: Secondary | ICD-10-CM | POA: Diagnosis not present

## 2017-05-08 DIAGNOSIS — I10 Essential (primary) hypertension: Secondary | ICD-10-CM | POA: Diagnosis not present

## 2017-05-08 DIAGNOSIS — K219 Gastro-esophageal reflux disease without esophagitis: Secondary | ICD-10-CM | POA: Diagnosis not present

## 2017-05-08 DIAGNOSIS — I2510 Atherosclerotic heart disease of native coronary artery without angina pectoris: Secondary | ICD-10-CM | POA: Diagnosis not present

## 2017-05-08 DIAGNOSIS — F339 Major depressive disorder, recurrent, unspecified: Secondary | ICD-10-CM | POA: Diagnosis not present

## 2017-05-08 DIAGNOSIS — C719 Malignant neoplasm of brain, unspecified: Secondary | ICD-10-CM | POA: Diagnosis not present

## 2017-05-08 DIAGNOSIS — R569 Unspecified convulsions: Secondary | ICD-10-CM | POA: Diagnosis not present

## 2017-05-10 DIAGNOSIS — K219 Gastro-esophageal reflux disease without esophagitis: Secondary | ICD-10-CM | POA: Diagnosis not present

## 2017-05-10 DIAGNOSIS — F339 Major depressive disorder, recurrent, unspecified: Secondary | ICD-10-CM | POA: Diagnosis not present

## 2017-05-10 DIAGNOSIS — I2510 Atherosclerotic heart disease of native coronary artery without angina pectoris: Secondary | ICD-10-CM | POA: Diagnosis not present

## 2017-05-10 DIAGNOSIS — C719 Malignant neoplasm of brain, unspecified: Secondary | ICD-10-CM | POA: Diagnosis not present

## 2017-05-10 DIAGNOSIS — I10 Essential (primary) hypertension: Secondary | ICD-10-CM | POA: Diagnosis not present

## 2017-05-10 DIAGNOSIS — R569 Unspecified convulsions: Secondary | ICD-10-CM | POA: Diagnosis not present

## 2017-05-14 DIAGNOSIS — K219 Gastro-esophageal reflux disease without esophagitis: Secondary | ICD-10-CM | POA: Diagnosis not present

## 2017-05-14 DIAGNOSIS — F339 Major depressive disorder, recurrent, unspecified: Secondary | ICD-10-CM | POA: Diagnosis not present

## 2017-05-14 DIAGNOSIS — I2510 Atherosclerotic heart disease of native coronary artery without angina pectoris: Secondary | ICD-10-CM | POA: Diagnosis not present

## 2017-05-14 DIAGNOSIS — I10 Essential (primary) hypertension: Secondary | ICD-10-CM | POA: Diagnosis not present

## 2017-05-14 DIAGNOSIS — C719 Malignant neoplasm of brain, unspecified: Secondary | ICD-10-CM | POA: Diagnosis not present

## 2017-05-14 DIAGNOSIS — R569 Unspecified convulsions: Secondary | ICD-10-CM | POA: Diagnosis not present

## 2017-05-15 DIAGNOSIS — I10 Essential (primary) hypertension: Secondary | ICD-10-CM | POA: Diagnosis not present

## 2017-05-15 DIAGNOSIS — I2510 Atherosclerotic heart disease of native coronary artery without angina pectoris: Secondary | ICD-10-CM | POA: Diagnosis not present

## 2017-05-15 DIAGNOSIS — F339 Major depressive disorder, recurrent, unspecified: Secondary | ICD-10-CM | POA: Diagnosis not present

## 2017-05-15 DIAGNOSIS — C719 Malignant neoplasm of brain, unspecified: Secondary | ICD-10-CM | POA: Diagnosis not present

## 2017-05-15 DIAGNOSIS — R569 Unspecified convulsions: Secondary | ICD-10-CM | POA: Diagnosis not present

## 2017-05-15 DIAGNOSIS — K219 Gastro-esophageal reflux disease without esophagitis: Secondary | ICD-10-CM | POA: Diagnosis not present

## 2017-05-18 DIAGNOSIS — I2510 Atherosclerotic heart disease of native coronary artery without angina pectoris: Secondary | ICD-10-CM | POA: Diagnosis not present

## 2017-05-18 DIAGNOSIS — I10 Essential (primary) hypertension: Secondary | ICD-10-CM | POA: Diagnosis not present

## 2017-05-18 DIAGNOSIS — F339 Major depressive disorder, recurrent, unspecified: Secondary | ICD-10-CM | POA: Diagnosis not present

## 2017-05-18 DIAGNOSIS — C719 Malignant neoplasm of brain, unspecified: Secondary | ICD-10-CM | POA: Diagnosis not present

## 2017-05-18 DIAGNOSIS — K219 Gastro-esophageal reflux disease without esophagitis: Secondary | ICD-10-CM | POA: Diagnosis not present

## 2017-05-18 DIAGNOSIS — R569 Unspecified convulsions: Secondary | ICD-10-CM | POA: Diagnosis not present

## 2017-05-19 DIAGNOSIS — I2510 Atherosclerotic heart disease of native coronary artery without angina pectoris: Secondary | ICD-10-CM | POA: Diagnosis not present

## 2017-05-19 DIAGNOSIS — I10 Essential (primary) hypertension: Secondary | ICD-10-CM | POA: Diagnosis not present

## 2017-05-19 DIAGNOSIS — C719 Malignant neoplasm of brain, unspecified: Secondary | ICD-10-CM | POA: Diagnosis not present

## 2017-05-19 DIAGNOSIS — K219 Gastro-esophageal reflux disease without esophagitis: Secondary | ICD-10-CM | POA: Diagnosis not present

## 2017-05-19 DIAGNOSIS — R569 Unspecified convulsions: Secondary | ICD-10-CM | POA: Diagnosis not present

## 2017-05-19 DIAGNOSIS — F339 Major depressive disorder, recurrent, unspecified: Secondary | ICD-10-CM | POA: Diagnosis not present

## 2017-05-20 DIAGNOSIS — C719 Malignant neoplasm of brain, unspecified: Secondary | ICD-10-CM | POA: Diagnosis not present

## 2017-05-20 DIAGNOSIS — I10 Essential (primary) hypertension: Secondary | ICD-10-CM | POA: Diagnosis not present

## 2017-05-20 DIAGNOSIS — K219 Gastro-esophageal reflux disease without esophagitis: Secondary | ICD-10-CM | POA: Diagnosis not present

## 2017-05-20 DIAGNOSIS — F339 Major depressive disorder, recurrent, unspecified: Secondary | ICD-10-CM | POA: Diagnosis not present

## 2017-05-20 DIAGNOSIS — R569 Unspecified convulsions: Secondary | ICD-10-CM | POA: Diagnosis not present

## 2017-05-20 DIAGNOSIS — E039 Hypothyroidism, unspecified: Secondary | ICD-10-CM | POA: Diagnosis not present

## 2017-05-20 DIAGNOSIS — I2510 Atherosclerotic heart disease of native coronary artery without angina pectoris: Secondary | ICD-10-CM | POA: Diagnosis not present

## 2017-05-22 DIAGNOSIS — C719 Malignant neoplasm of brain, unspecified: Secondary | ICD-10-CM | POA: Diagnosis not present

## 2017-05-22 DIAGNOSIS — K219 Gastro-esophageal reflux disease without esophagitis: Secondary | ICD-10-CM | POA: Diagnosis not present

## 2017-05-22 DIAGNOSIS — F339 Major depressive disorder, recurrent, unspecified: Secondary | ICD-10-CM | POA: Diagnosis not present

## 2017-05-22 DIAGNOSIS — I2510 Atherosclerotic heart disease of native coronary artery without angina pectoris: Secondary | ICD-10-CM | POA: Diagnosis not present

## 2017-05-22 DIAGNOSIS — R569 Unspecified convulsions: Secondary | ICD-10-CM | POA: Diagnosis not present

## 2017-05-22 DIAGNOSIS — I10 Essential (primary) hypertension: Secondary | ICD-10-CM | POA: Diagnosis not present

## 2017-05-24 DIAGNOSIS — K219 Gastro-esophageal reflux disease without esophagitis: Secondary | ICD-10-CM | POA: Diagnosis not present

## 2017-05-24 DIAGNOSIS — F339 Major depressive disorder, recurrent, unspecified: Secondary | ICD-10-CM | POA: Diagnosis not present

## 2017-05-24 DIAGNOSIS — R569 Unspecified convulsions: Secondary | ICD-10-CM | POA: Diagnosis not present

## 2017-05-24 DIAGNOSIS — I10 Essential (primary) hypertension: Secondary | ICD-10-CM | POA: Diagnosis not present

## 2017-05-24 DIAGNOSIS — C719 Malignant neoplasm of brain, unspecified: Secondary | ICD-10-CM | POA: Diagnosis not present

## 2017-05-24 DIAGNOSIS — I2510 Atherosclerotic heart disease of native coronary artery without angina pectoris: Secondary | ICD-10-CM | POA: Diagnosis not present

## 2017-05-25 DIAGNOSIS — C719 Malignant neoplasm of brain, unspecified: Secondary | ICD-10-CM | POA: Diagnosis not present

## 2017-05-25 DIAGNOSIS — F339 Major depressive disorder, recurrent, unspecified: Secondary | ICD-10-CM | POA: Diagnosis not present

## 2017-05-25 DIAGNOSIS — I2510 Atherosclerotic heart disease of native coronary artery without angina pectoris: Secondary | ICD-10-CM | POA: Diagnosis not present

## 2017-05-25 DIAGNOSIS — R569 Unspecified convulsions: Secondary | ICD-10-CM | POA: Diagnosis not present

## 2017-05-25 DIAGNOSIS — K219 Gastro-esophageal reflux disease without esophagitis: Secondary | ICD-10-CM | POA: Diagnosis not present

## 2017-05-25 DIAGNOSIS — I10 Essential (primary) hypertension: Secondary | ICD-10-CM | POA: Diagnosis not present

## 2017-05-26 DIAGNOSIS — K219 Gastro-esophageal reflux disease without esophagitis: Secondary | ICD-10-CM | POA: Diagnosis not present

## 2017-05-26 DIAGNOSIS — I10 Essential (primary) hypertension: Secondary | ICD-10-CM | POA: Diagnosis not present

## 2017-05-26 DIAGNOSIS — C719 Malignant neoplasm of brain, unspecified: Secondary | ICD-10-CM | POA: Diagnosis not present

## 2017-05-26 DIAGNOSIS — I2510 Atherosclerotic heart disease of native coronary artery without angina pectoris: Secondary | ICD-10-CM | POA: Diagnosis not present

## 2017-05-26 DIAGNOSIS — R569 Unspecified convulsions: Secondary | ICD-10-CM | POA: Diagnosis not present

## 2017-05-26 DIAGNOSIS — F339 Major depressive disorder, recurrent, unspecified: Secondary | ICD-10-CM | POA: Diagnosis not present

## 2017-05-30 DIAGNOSIS — C719 Malignant neoplasm of brain, unspecified: Secondary | ICD-10-CM | POA: Diagnosis not present

## 2017-05-30 DIAGNOSIS — F339 Major depressive disorder, recurrent, unspecified: Secondary | ICD-10-CM | POA: Diagnosis not present

## 2017-05-30 DIAGNOSIS — R569 Unspecified convulsions: Secondary | ICD-10-CM | POA: Diagnosis not present

## 2017-05-30 DIAGNOSIS — K219 Gastro-esophageal reflux disease without esophagitis: Secondary | ICD-10-CM | POA: Diagnosis not present

## 2017-05-30 DIAGNOSIS — I2510 Atherosclerotic heart disease of native coronary artery without angina pectoris: Secondary | ICD-10-CM | POA: Diagnosis not present

## 2017-05-30 DIAGNOSIS — I10 Essential (primary) hypertension: Secondary | ICD-10-CM | POA: Diagnosis not present

## 2017-05-31 DIAGNOSIS — K219 Gastro-esophageal reflux disease without esophagitis: Secondary | ICD-10-CM | POA: Diagnosis not present

## 2017-05-31 DIAGNOSIS — I2510 Atherosclerotic heart disease of native coronary artery without angina pectoris: Secondary | ICD-10-CM | POA: Diagnosis not present

## 2017-05-31 DIAGNOSIS — F339 Major depressive disorder, recurrent, unspecified: Secondary | ICD-10-CM | POA: Diagnosis not present

## 2017-05-31 DIAGNOSIS — I10 Essential (primary) hypertension: Secondary | ICD-10-CM | POA: Diagnosis not present

## 2017-05-31 DIAGNOSIS — R569 Unspecified convulsions: Secondary | ICD-10-CM | POA: Diagnosis not present

## 2017-05-31 DIAGNOSIS — C719 Malignant neoplasm of brain, unspecified: Secondary | ICD-10-CM | POA: Diagnosis not present

## 2017-06-01 DIAGNOSIS — I10 Essential (primary) hypertension: Secondary | ICD-10-CM | POA: Diagnosis not present

## 2017-06-01 DIAGNOSIS — K219 Gastro-esophageal reflux disease without esophagitis: Secondary | ICD-10-CM | POA: Diagnosis not present

## 2017-06-01 DIAGNOSIS — C719 Malignant neoplasm of brain, unspecified: Secondary | ICD-10-CM | POA: Diagnosis not present

## 2017-06-01 DIAGNOSIS — F339 Major depressive disorder, recurrent, unspecified: Secondary | ICD-10-CM | POA: Diagnosis not present

## 2017-06-01 DIAGNOSIS — R569 Unspecified convulsions: Secondary | ICD-10-CM | POA: Diagnosis not present

## 2017-06-01 DIAGNOSIS — I2510 Atherosclerotic heart disease of native coronary artery without angina pectoris: Secondary | ICD-10-CM | POA: Diagnosis not present

## 2017-06-02 DIAGNOSIS — R569 Unspecified convulsions: Secondary | ICD-10-CM | POA: Diagnosis not present

## 2017-06-02 DIAGNOSIS — I2510 Atherosclerotic heart disease of native coronary artery without angina pectoris: Secondary | ICD-10-CM | POA: Diagnosis not present

## 2017-06-02 DIAGNOSIS — F339 Major depressive disorder, recurrent, unspecified: Secondary | ICD-10-CM | POA: Diagnosis not present

## 2017-06-02 DIAGNOSIS — I10 Essential (primary) hypertension: Secondary | ICD-10-CM | POA: Diagnosis not present

## 2017-06-02 DIAGNOSIS — C719 Malignant neoplasm of brain, unspecified: Secondary | ICD-10-CM | POA: Diagnosis not present

## 2017-06-02 DIAGNOSIS — K219 Gastro-esophageal reflux disease without esophagitis: Secondary | ICD-10-CM | POA: Diagnosis not present

## 2017-06-20 DEATH — deceased

## 2017-08-14 ENCOUNTER — Encounter: Payer: Self-pay | Admitting: Gastroenterology

## 2018-11-24 IMAGING — CT CT HEAD W/O CM
1 of 3 series · 15 of 30 positions shown, 19 images · non-contrast
Comparison: 07/30/2016.

CLINICAL DATA: Continued surveillance and preoperative planning
RIGHT hemisphere lesion.

EXAM:
CT HEAD WITHOUT CONTRAST
TECHNIQUE: Contiguous axial images were obtained from the base of the skull
through the vertex without intravenous contrast.

[Series 201: brainlab series, idose (1) · axial · 0.45mm/px · z∈[-237,-87]mm · 15 of 172 slices shown, 19 images]
[im 11/172  brain]
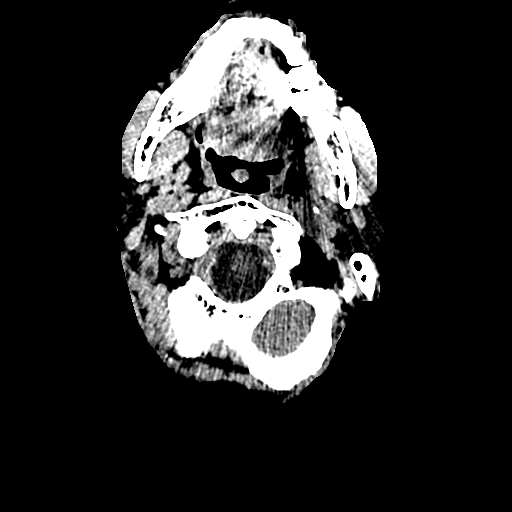
[im 11/172  bone]
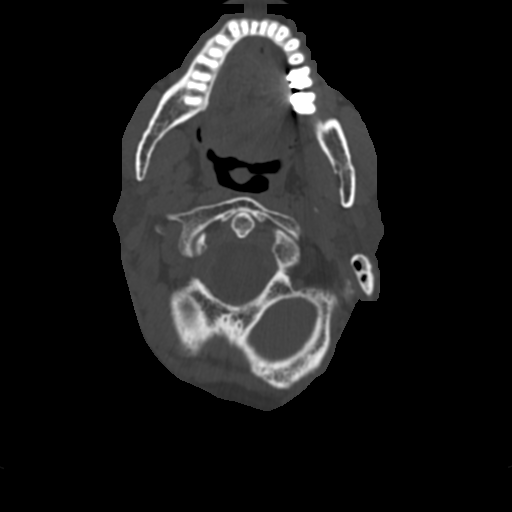
[im 22/172  brain]
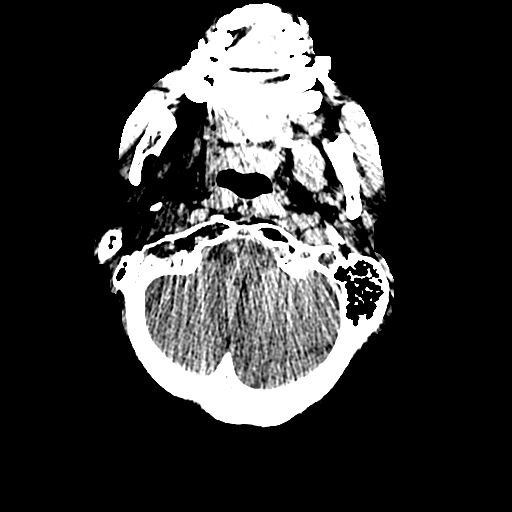
[im 33/172  brain]
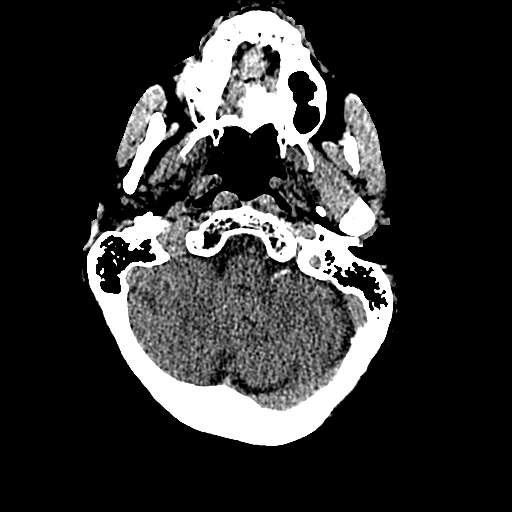
[im 43/172  brain]
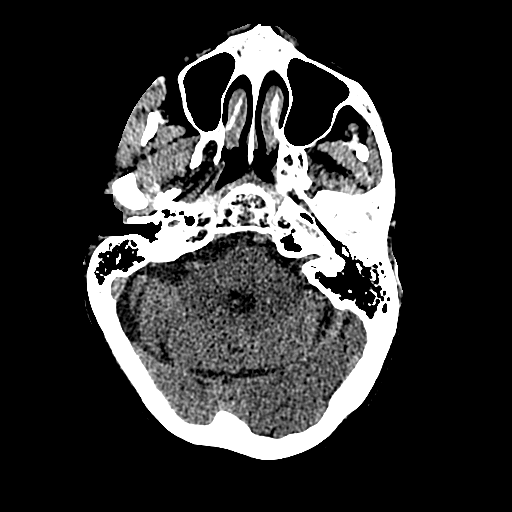
[im 54/172  brain]
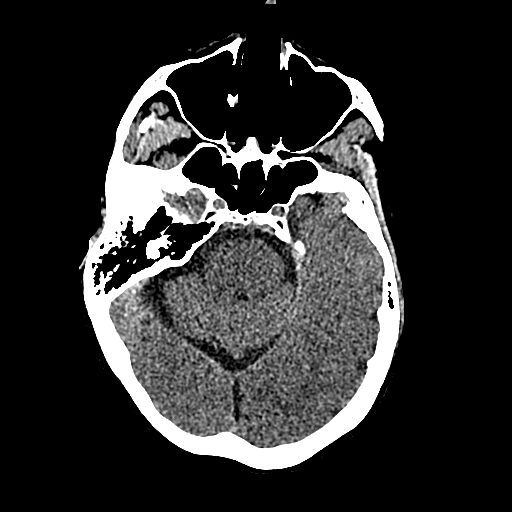
[im 54/172  bone]
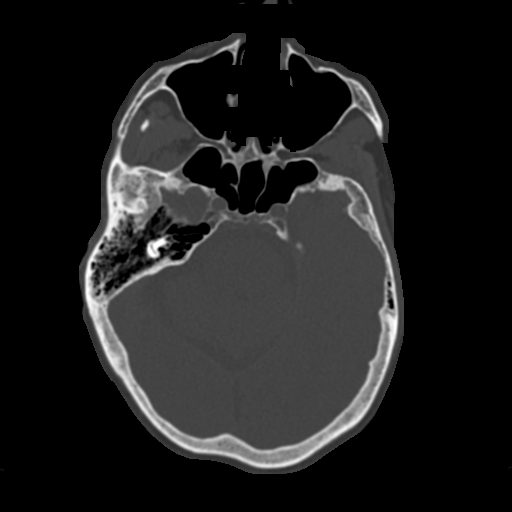
[im 65/172  brain]
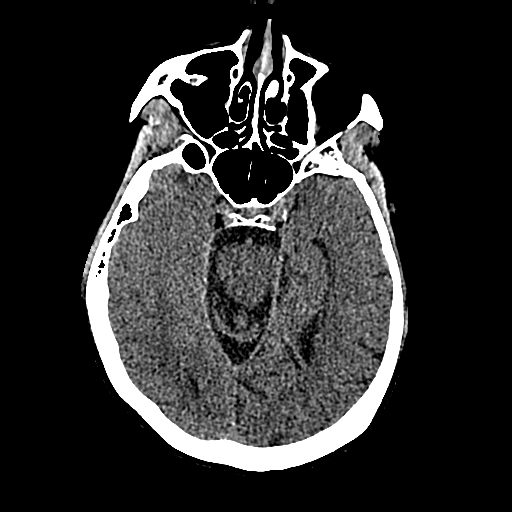
[im 75/172  brain]
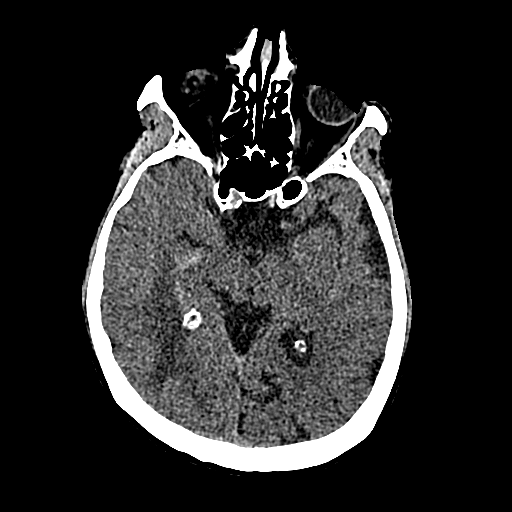
[im 86/172  brain]
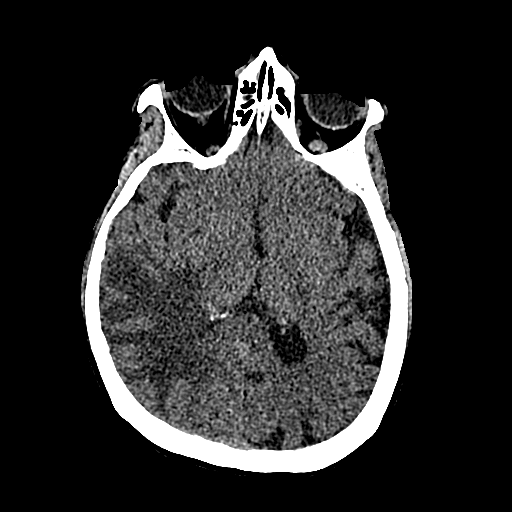
[im 97/172  brain]
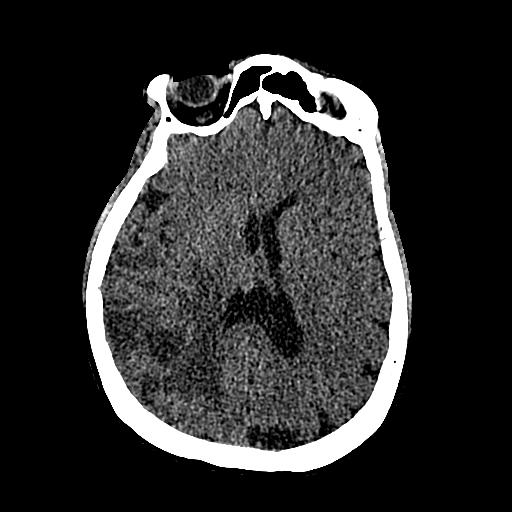
[im 97/172  bone]
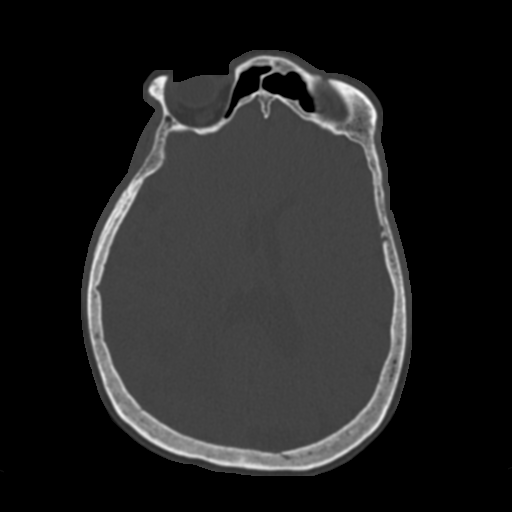
[im 107/172  brain]
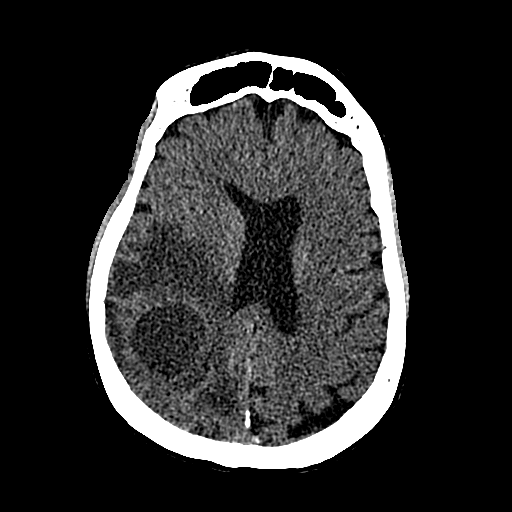
[im 118/172  brain]
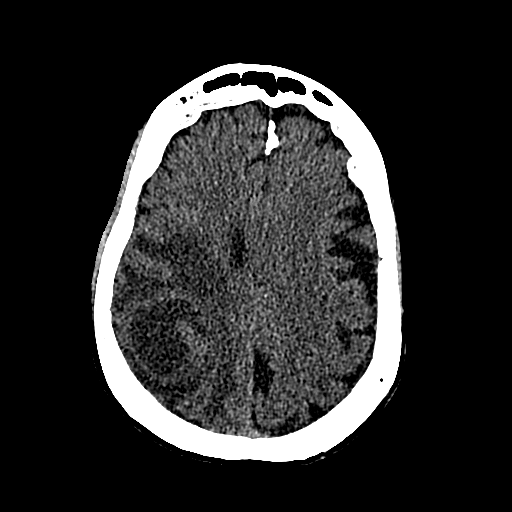
[im 129/172  brain]
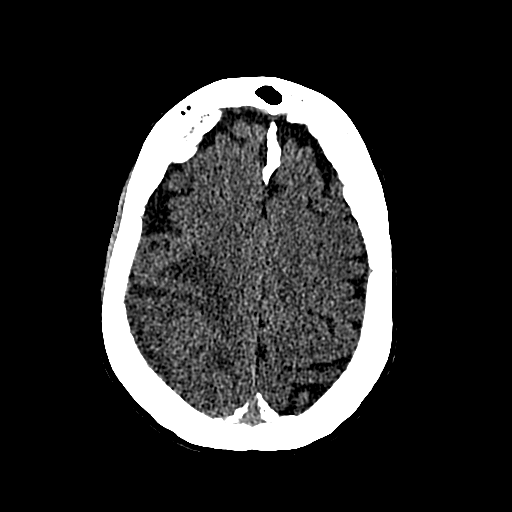
[im 139/172  brain]
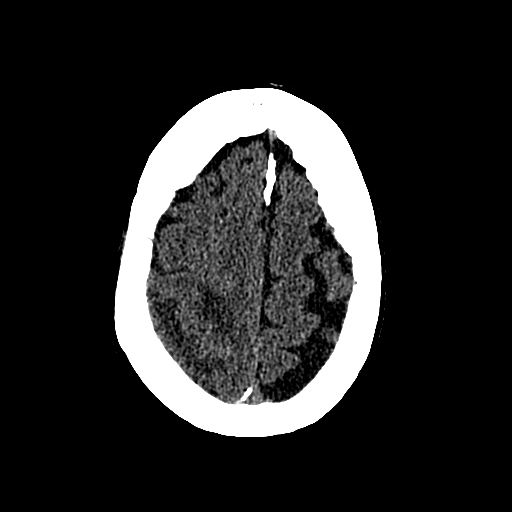
[im 139/172  bone]
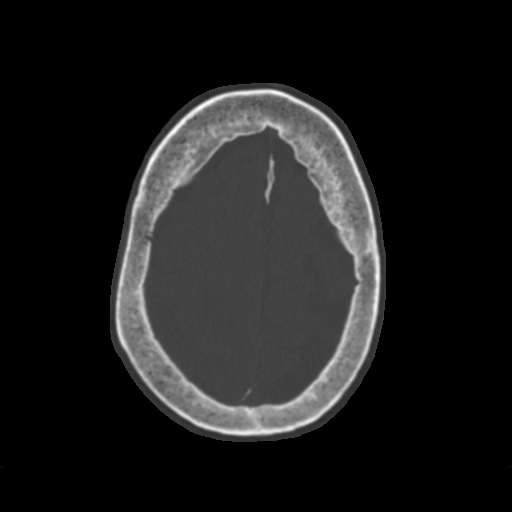
[im 150/172  brain]
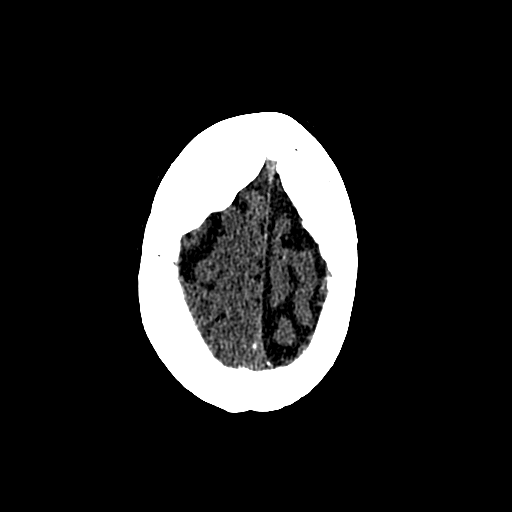
[im 161/172  brain]
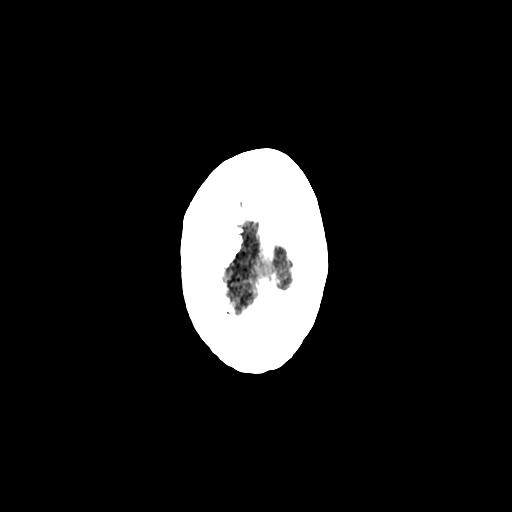

[15 of 30 positions shown; findings below may reference images not displayed]

FINDINGS: Brain: 1 mm thick images were obtained through the large RIGHT
hemisphere. 4 cm Cystic and solid lesion is redemonstrated, without
significant change in size or hemorrhagic transformation. The edema
pattern has not improved significantly following steroid
administration.

Vascular: No hyperdense vessel or unexpected calcification.

Skull: Normal. Negative for fracture or focal lesion.

Sinuses/Orbits: No acute finding.

Other: None.
IMPRESSION: [REDACTED] protocol demonstrating cystic and solid RIGHT hemisphere
lesion with surrounding edema, stable.
